# Patient Record
Sex: Female | Born: 1955 | Race: White | Hispanic: No | Smoking: Never smoker
Health system: Southern US, Community
[De-identification: ages and names within clinical notes are randomized; demographics above are authoritative.]

## PROBLEM LIST (undated history)

## (undated) DIAGNOSIS — K219 Gastro-esophageal reflux disease without esophagitis: Secondary | ICD-10-CM

## (undated) DIAGNOSIS — G4730 Sleep apnea, unspecified: Secondary | ICD-10-CM

## (undated) DIAGNOSIS — A048 Other specified bacterial intestinal infections: Secondary | ICD-10-CM

## (undated) DIAGNOSIS — J45909 Unspecified asthma, uncomplicated: Secondary | ICD-10-CM

## (undated) DIAGNOSIS — R51 Headache: Secondary | ICD-10-CM

## (undated) DIAGNOSIS — T8859XA Other complications of anesthesia, initial encounter: Secondary | ICD-10-CM

## (undated) DIAGNOSIS — K5792 Diverticulitis of intestine, part unspecified, without perforation or abscess without bleeding: Secondary | ICD-10-CM

## (undated) DIAGNOSIS — T4145XA Adverse effect of unspecified anesthetic, initial encounter: Secondary | ICD-10-CM

## (undated) DIAGNOSIS — M1990 Unspecified osteoarthritis, unspecified site: Secondary | ICD-10-CM

## (undated) DIAGNOSIS — N390 Urinary tract infection, site not specified: Secondary | ICD-10-CM

## (undated) DIAGNOSIS — F419 Anxiety disorder, unspecified: Secondary | ICD-10-CM

## (undated) DIAGNOSIS — R519 Headache, unspecified: Secondary | ICD-10-CM

## (undated) DIAGNOSIS — G2581 Restless legs syndrome: Secondary | ICD-10-CM

## (undated) DIAGNOSIS — F32A Depression, unspecified: Secondary | ICD-10-CM

## (undated) DIAGNOSIS — M797 Fibromyalgia: Secondary | ICD-10-CM

## (undated) DIAGNOSIS — M779 Enthesopathy, unspecified: Secondary | ICD-10-CM

## (undated) DIAGNOSIS — I10 Essential (primary) hypertension: Secondary | ICD-10-CM

## (undated) DIAGNOSIS — F329 Major depressive disorder, single episode, unspecified: Secondary | ICD-10-CM

## (undated) HISTORY — DX: Other specified bacterial intestinal infections: A04.8

## (undated) HISTORY — DX: Gastro-esophageal reflux disease without esophagitis: K21.9

## (undated) HISTORY — PX: FOOT SURGERY: SHX648

## (undated) HISTORY — DX: Urinary tract infection, site not specified: N39.0

## (undated) HISTORY — DX: Fibromyalgia: M79.7

## (undated) HISTORY — PX: PARTIAL HYSTERECTOMY: SHX80

## (undated) HISTORY — DX: Enthesopathy, unspecified: M77.9

---

## 1997-06-15 ENCOUNTER — Inpatient Hospital Stay (HOSPITAL_COMMUNITY): Admission: RE | Admit: 1997-06-15 | Discharge: 1997-06-16 | Payer: Self-pay | Admitting: Obstetrics and Gynecology

## 1998-09-20 ENCOUNTER — Encounter: Payer: Self-pay | Admitting: Emergency Medicine

## 1998-09-20 ENCOUNTER — Emergency Department (HOSPITAL_COMMUNITY): Admission: EM | Admit: 1998-09-20 | Discharge: 1998-09-20 | Payer: Self-pay | Admitting: Emergency Medicine

## 1998-12-09 ENCOUNTER — Ambulatory Visit (HOSPITAL_COMMUNITY): Admission: RE | Admit: 1998-12-09 | Discharge: 1998-12-09 | Payer: Self-pay | Admitting: Urology

## 1998-12-09 ENCOUNTER — Encounter: Payer: Self-pay | Admitting: Urology

## 1999-07-10 ENCOUNTER — Inpatient Hospital Stay (HOSPITAL_COMMUNITY): Admission: AD | Admit: 1999-07-10 | Discharge: 1999-07-14 | Payer: Self-pay | Admitting: *Deleted

## 1999-07-28 ENCOUNTER — Other Ambulatory Visit: Admission: RE | Admit: 1999-07-28 | Discharge: 1999-08-10 | Payer: Self-pay

## 2001-05-09 ENCOUNTER — Ambulatory Visit (HOSPITAL_COMMUNITY): Admission: RE | Admit: 2001-05-09 | Discharge: 2001-05-09 | Payer: Self-pay | Admitting: Internal Medicine

## 2001-05-09 ENCOUNTER — Encounter: Payer: Self-pay | Admitting: Internal Medicine

## 2001-12-09 ENCOUNTER — Emergency Department (HOSPITAL_COMMUNITY): Admission: EM | Admit: 2001-12-09 | Discharge: 2001-12-09 | Payer: Self-pay | Admitting: *Deleted

## 2001-12-09 ENCOUNTER — Encounter: Payer: Self-pay | Admitting: *Deleted

## 2002-06-18 ENCOUNTER — Emergency Department (HOSPITAL_COMMUNITY): Admission: EM | Admit: 2002-06-18 | Discharge: 2002-06-19 | Payer: Self-pay | Admitting: Emergency Medicine

## 2002-06-18 ENCOUNTER — Encounter: Payer: Self-pay | Admitting: Emergency Medicine

## 2002-07-06 ENCOUNTER — Encounter: Payer: Self-pay | Admitting: Internal Medicine

## 2002-07-06 ENCOUNTER — Ambulatory Visit (HOSPITAL_COMMUNITY): Admission: RE | Admit: 2002-07-06 | Discharge: 2002-07-06 | Payer: Self-pay | Admitting: Internal Medicine

## 2003-01-01 ENCOUNTER — Ambulatory Visit (HOSPITAL_COMMUNITY): Admission: RE | Admit: 2003-01-01 | Discharge: 2003-01-01 | Payer: Self-pay | Admitting: Internal Medicine

## 2003-01-01 ENCOUNTER — Encounter: Payer: Self-pay | Admitting: Internal Medicine

## 2003-03-16 ENCOUNTER — Ambulatory Visit (HOSPITAL_COMMUNITY): Admission: RE | Admit: 2003-03-16 | Discharge: 2003-03-16 | Payer: Self-pay | Admitting: Internal Medicine

## 2003-03-24 ENCOUNTER — Ambulatory Visit (HOSPITAL_COMMUNITY): Admission: RE | Admit: 2003-03-24 | Discharge: 2003-03-24 | Payer: Self-pay | Admitting: Internal Medicine

## 2003-08-31 ENCOUNTER — Emergency Department (HOSPITAL_COMMUNITY): Admission: EM | Admit: 2003-08-31 | Discharge: 2003-08-31 | Payer: Self-pay | Admitting: Family Medicine

## 2004-03-16 ENCOUNTER — Ambulatory Visit: Payer: Self-pay | Admitting: Psychiatry

## 2004-03-16 ENCOUNTER — Inpatient Hospital Stay (HOSPITAL_COMMUNITY): Admission: RE | Admit: 2004-03-16 | Discharge: 2004-03-20 | Payer: Self-pay | Admitting: Psychiatry

## 2004-07-12 ENCOUNTER — Ambulatory Visit (HOSPITAL_COMMUNITY): Admission: RE | Admit: 2004-07-12 | Discharge: 2004-07-12 | Payer: Self-pay | Admitting: Internal Medicine

## 2004-10-04 ENCOUNTER — Emergency Department (HOSPITAL_COMMUNITY): Admission: EM | Admit: 2004-10-04 | Discharge: 2004-10-04 | Payer: Self-pay | Admitting: Family Medicine

## 2005-02-02 ENCOUNTER — Encounter: Payer: Self-pay | Admitting: Emergency Medicine

## 2005-02-03 ENCOUNTER — Inpatient Hospital Stay (HOSPITAL_COMMUNITY): Admission: EM | Admit: 2005-02-03 | Discharge: 2005-02-06 | Payer: Self-pay | Admitting: Psychiatry

## 2005-02-03 ENCOUNTER — Ambulatory Visit: Payer: Self-pay | Admitting: Psychiatry

## 2005-08-09 ENCOUNTER — Emergency Department (HOSPITAL_COMMUNITY): Admission: EM | Admit: 2005-08-09 | Discharge: 2005-08-09 | Payer: Self-pay | Admitting: Family Medicine

## 2005-12-25 ENCOUNTER — Emergency Department (HOSPITAL_COMMUNITY): Admission: EM | Admit: 2005-12-25 | Discharge: 2005-12-25 | Payer: Self-pay | Admitting: Family Medicine

## 2006-03-22 ENCOUNTER — Encounter (HOSPITAL_COMMUNITY): Admission: RE | Admit: 2006-03-22 | Discharge: 2006-04-08 | Payer: Self-pay | Admitting: Internal Medicine

## 2006-08-09 ENCOUNTER — Inpatient Hospital Stay (HOSPITAL_COMMUNITY): Admission: AD | Admit: 2006-08-09 | Discharge: 2006-08-14 | Payer: Self-pay | Admitting: Internal Medicine

## 2008-01-14 ENCOUNTER — Ambulatory Visit (HOSPITAL_COMMUNITY): Admission: RE | Admit: 2008-01-14 | Discharge: 2008-01-14 | Payer: Self-pay | Admitting: Internal Medicine

## 2010-04-29 ENCOUNTER — Encounter: Payer: Self-pay | Admitting: Internal Medicine

## 2010-04-30 ENCOUNTER — Encounter: Payer: Self-pay | Admitting: Obstetrics & Gynecology

## 2010-08-25 NOTE — H&P (Signed)
NAMEANISTYN, GRADDY NO.:  [id number]   MEDICAL RECORD NO.:  [id number]          PATIENT TYPE:  IPS   LOCATION:  0506                          FACILITY:  [facility]   PHYSICIAN:  Geoffery Lyons, M.D.      DATE OF BIRTH:  [birth year]   DATE OF ADMISSION:  02/03/2005  DATE OF DISCHARGE:                         PSYCHIATRIC ADMISSION ASSESSMENT   IDENTIFYING INFORMATION:  The patient is a 55 year old divorced white female  who was voluntarily admitted to the [facility] on February 03, 2005.   HISTORY OF PRESENT ILLNESS:  The patient has a history of depression and  presents complaining of increased stressors of her difficult living  situation, health problems and her relationships with her daughters which  became overwhelming, and the patient took and overdose of approximately 40  Relafen tablets claiming that she wanted to go to sleep.  The patient was  taken to the hospital where she was kept for several days after receiving  charcoal treatment and then was transferred to the [facility].   PAST PSYCHIATRIC HISTORY:  This is the fifth hospitalization at the  [facility] for this patient.  She has had no outpatient  psychiatrist or counselor, although she has been referred to one by her  primary care doctor.   SOCIAL HISTORY:  She currently lives in a boarding home.  She works at  [employer], and she has a 12th grade education.  She is divorced and has 72-  year-old and 50 year old daughters.  She denies any legal issues at this  time.  She describes her daughters as being her social support system.   FAMILY HISTORY:  She has a sister who is an alcohol abuser.   ALCOHOL AND DRUG HISTORY:  She reports that she rarely drinks any alcohol,  and she does not use any type of illicit drugs.   PRIMARY CARE Martino Tompson:  Dr. Sherwood Gambler in [place], [place].   MEDICAL PROBLEMS:  Include fibromyalgia.  GERD.  Sleep apnea.   CURRENT  MEDICATIONS:  1.  Relafen 500 mg twice daily.  2.  Prozac 40 mg once daily.  3.  Klonopin 1 mg t.i.d. or q.i.d.  4.  Neurontin 600 q.i.d.  5.  Darvocet-N 100 one tablet q.i.d.   ALLERGIES:  PENICILLIN and CODEINE.  She will not take PREDNISONE.   PHYSICAL EXAMINATION:  Performed at [facility] Emergency Department.  Here  at the [facility], she is a well-developed, well-nourished  female who looks appropriate for her stated age of 55 years old.   DIAGNOSTIC STUDIES:  CBC was within normal limits.  Her blood chemistries  are within normal limits.  Her liver function tests are within normal  limits.  Urine pregnancy test is negative.  Her acetaminophen and salicylate  levels are within normal limits.  Her urine drug screen is positive for  benzos and tricyclic antidepressants.  Her urinalysis was normal.  She has a  TSH pending.   MENTAL STATUS EXAM:  She is alert and oriented x 4.  She has good eye  contact with constructed affect.  Her appearance was disheveled.  Her  behavior is cooperative.  Her speech was clear with even pace and tone.  Her  mood is anxious.  Her thought process was coherent without current suicidal  ideations or homicidal ideation. No evidence of psychosis or mania was  noted.  Her cognitive function, her concentration is normal.  Her memory is  impaired.  Her insight is poor.  Her judgment is poor.   DIAGNOSES:  AXIS I:  Major depressive disorder, recurrent, severe with  suicidal ideation.  AXIS II:  Deferred.  AXIS III:  Fibromyalgia.  Gastroesophageal reflux disease.  Sleep apnea.  AXIS IV:  Severe for problem with her primary support group, occupational  problems, housing problems, economic problems and medical problems.  AXIS V:  Current global assessment of function of 34, past year 55.   PLAN:  Admit the patient voluntarily and stabilize her to mood and thought.  We will work to increase her coping skills and decrease her stress.  We   would like to have a family session with her daughters prior to discharge.  She can follow up with the psychiatrist and counselor who were referred by  her primary care doctor, Dr. Sherwood Gambler.  Her tentative length of stay is 5-7  days.      Yolande Jolly, PA      Geoffery Lyons, M.D.  Electronically Signed    AHW/MEDQ  D:  02/03/2005  T:  02/03/2005  Job:  [id number]

## 2010-08-25 NOTE — Discharge Summary (Signed)
[name], BOYUM NO.:  [id number]   MEDICAL RECORD NO.:  [id number]          PATIENT TYPE:  IPS   LOCATION:  0506                          FACILITY:  [facility]   PHYSICIAN:  Anselm Jungling, MD  DATE OF BIRTH:  May 28, 1955   DATE OF ADMISSION:  02/03/2005  DATE OF DISCHARGE:  02/06/2005                                 DISCHARGE SUMMARY   IDENTIFYING DATA/REASON FOR ADMISSION:  The patient is a 55 year old  divorced white female voluntarily admitted due to increased depression, and  an overdose in a suicide attempt of 40 tablets of Relafen. The patient was  treated at [facility] for several days and then transferred to the  [facility]. This is her fifth hospitalization here at [facility]. She  is not involved in any outpatient mental health treatment at this time.  Please refer to the admission note for further details pertaining to the  symptoms, circumstances and history that led to her hospitalization.   INITIAL DIAGNOSTIC IMPRESSION:  She was given an AXIS I initial diagnosis of  major depressive disorder, recurrent, severe with suicidal ideation.   MEDICATIONS:  Psychotropics at the time of admission included Prozac 40 mg  daily, Klonopin 1 mg t.i.d. and Neurontin 600 q.i.d. In addition, she had  been taking nonpsychotropics, Relafen 500 mg b.i.d. and Darvocet N 100  q.i.d.   MEDICAL/LABORATORY:  As above. The patient was medically treated at [facility] prior to transfer to the inpatient psychiatric service.   The patient was continued on her regimen of Darvocet p.r.n., and was given  Orabase gel for a broken tooth.   HOSPITAL COURSE:  The patient was admitted to the adult inpatient service.  She was continued on her usual Prozac 40 mg daily, and Neurontin 600 mg  q.i.d. Klonopin was decreased to 0.75 mg t.i.d. Seroquel was available on a  p.r.n. basis, but patient indicated a desire to minimize her use of it  because it was medication  that she could not afford on an outpatient basis.   Over the patient's four-day hospital course, she did not experience any  further active thoughts of self-harm or suicidal ideation. She was a  reasonably good participant in the treatment program. On the fourth hospital  day, she requested discharge and was able to indicate convincingly that she  had no further thoughts of self-harm or suicide.   AFTERCARE:  The patient is to follow-up with Elfredia Nevins, her primary  care physician, on February 19, 2005. She was to resume her usual  medications, as described above. Pain medications were to be prescribed as  by Dr. Sherwood Gambler.   DISCHARGE DIAGNOSES:  AXIS I: Major depressive disorder, recurrent, severe.  AXIS II: Deferred.  AXIS III: No acute or chronic illnesses.  AXIS IV: Stressors:  severe.  AXIS V: GAF on discharge 60.           ______________________________  Anselm Jungling, MD  Electronically Signed     SPB/MEDQ  D:  02/06/2005  T:  02/06/2005  Job:  [id number]

## 2010-08-25 NOTE — Discharge Summary (Signed)
Hannah Jordan, Hannah Jordan               ACCOUNT NO.:  [id number]   MEDICAL RECORD NO.:  [id number]          PATIENT TYPE:  INP   LOCATION:  A311                          FACILITY:  [facility]   PHYSICIAN:  Madelin Rear. Sherwood Gambler, MD  DATE OF BIRTH:  [birth year]   DATE OF ADMISSION:  08/09/2006  DATE OF DISCHARGE:  05/07/2008LH                               DISCHARGE SUMMARY   DISCHARGE DIAGNOSIS:  1. Diverticulitis.  2. Oral stomatitis secondary to Candida.  3. Fibromyalgia.  4. Gastroesophageal reflux disease.  5. Acute allergic reaction secondary to quinolone.   DISCHARGE MEDICATIONS:  1. Xanax 0.5 p.o. t.i.d. p.r.n.  2. Prozac 20 mg p.o. daily.  3. Lunesta 30 mg p.o. daily.  4. Darvocet N 100 q.i.d. p.r.n. pain.  5. Flagyl 500 mg p.o. q.i.d.  6. Doxycycline 100 mg p.o. b.i.d.   SUMMARY:  The patient was admitted after outpatient confirmation by CT  of diverticulitis of a minimal degree without perforation or abscess  with poor response to oral therapy.  Due to increase in her pain and  poor response orally she was admitted for IV therapy.  A CT was obtained  again and showed no evidence of perforation or abscess.  Gradually she  improved, however, she did initially develop a facial rash after some  Levaquin was given parenterally.  This was resolved without any other  anaphylaxis complications.  She was changed to tetracycline combined  with Flagyl for anaerobe and gram-negative coverage.   On the day of discharge she was stable.  Follow-up in the office.      Madelin Rear. Sherwood Gambler, MD  Electronically Signed     LJF/MEDQ  D:  09/02/2006  T:  09/02/2006  Job:  [id number]

## 2010-08-25 NOTE — H&P (Signed)
Hannah Jordan, Hannah Jordan               ACCOUNT NO.:  [id number]   MEDICAL RECORD NO.:  [id number]          PATIENT TYPE:  INP   LOCATION:  A311                          FACILITY:  [facility]   PHYSICIAN:  Madelin Rear. Sherwood Gambler, MD  DATE OF BIRTH:  [birth year]   DATE OF ADMISSION:  08/09/2006  DATE OF DISCHARGE:  LH                              HISTORY & PHYSICAL   CHIEF COMPLAINT:  Abdominal pain.   HISTORY OF PRESENT ILLNESS:  The patient was seen August 05, 2006 in the  office with left lower quadrant abdominal pain that was subsequently  confirmed as diverticulitis of a mild degree by CT scan at an outpatient  facility.  She had associated nausea and malaise, possible a low-grade  fever, but no true rigors.  No hematemesis, hematochezia, or melena.  She also complained of a right lower lip lesion after a fall for 2 weeks  with a granuloma that needed to be resected as an outpatient.  The  location of her pain as in the left lower quadrant.  Severity is  moderate and actually possibly worse as opposed to better after 5 days  of outpatient Cipro and Flagyl orally.  Associated symptoms as above.  Increased pain with ambulation and coughing.   PAST MEDICAL HISTORY:  1. Status post partial hysterectomy.  2. Left foot surgery.  3. C-section x2.  4. Fibromyalgia.  5. Recurrent tendinitis.  6. Recurrent urinary tract infection.  7. Gastroesophageal reflux disease.  8. Previously treated Helicobacter pylori.   Noted PENICILLIN, CODEINE and PROZAC allergy.   SOCIAL HISTORY:  Nonsmoker. No alcohol or other drug use.   FAMILY HISTORY:  Diabetes mellitus, cancer, hypothyroidism,  hypertension, otherwise noncontributory.   REVIEW OF SYSTEMS:  In detail, no  headache, blurred vision, double  vision, or hearing changes.  No sore throat or odynophagia, dysphasia,  no tinnitus.  No stiff neck.  No thyromegaly or masses of the neck.  CARDIOPULMONARY: No palpitations, syncope, chest pain. No shortness  of  breath, cough, or sputum.  SKIN:  No allergy, rash, no exposure to  ticks.  Lip lesion as described above, resected already and healing  well.  ABDOMEN:  No diarrhea. see HPI for complete details. No vomiting.  Positive nausea.  EXTREMITIES: No clubbing, cyanosis, or edema noted by  the patient. ALLERGY/IMMUNOLOGIC:  No swollen glands, no urticaria, no  skin rash.  CONSTITUTIONAL:  As under HPI.   PHYSICAL EXAMINATION:  GENERAL:  She is awake, alert, cooperative.  Moves easily about stretcher.  HEAD/NECK: No JVD or adenopathy.  NECK:  Supple.  Chest: Clear.  CARDIAC:  Regular rhythm.  No murmur, gallop, or  rub.  ABDOMEN: Positive normoactive bowel sounds.  No organomegaly or  masses. Positive tender left lower quadrant, with intermittent voluntary  guarding.  EXTREMITIES: No clubbing, cyanosis, or edema.  NEUROLOGIC:  Nonfocal.   LABORATORIES:  Outpatient CT as above, showing mild diverticulitis,  otherwise negative.   IMPRESSION:  1. Diverticulitis. Failure to respond to conservative outpatient      regimen and oral therapy and in fact possibly worse.  Will  admit      her for parenteral antibiotics.  Repeat CT. Make sure she did not      abscess signs, and possible surgical consultation as indicated with      Dr. Malvin Johns.  2. Fibromyalgia. Stable at present, p.r.n. analgesia as indicated.  3. Gastroesophageal reflux disease.  Continue her proton pump      inhibitor in the form of Protonix.      Madelin Rear. Sherwood Gambler, MD  Electronically Signed     LJF/MEDQ  D:  08/09/2006  T:  08/09/2006  Job:  [id number]

## 2010-08-25 NOTE — Discharge Summary (Signed)
NAMECASSIOPEIA, FLORENTINO NO.:  [id number]   MEDICAL RECORD NO.:  [id number]          PATIENT TYPE:  IPS   LOCATION:  0508                          FACILITY:  [facility]   PHYSICIAN:  Geoffery Lyons, M.D.      DATE OF BIRTH:  Feb 17, 1956   DATE OF ADMISSION:  03/16/2004  DATE OF DISCHARGE:  03/20/2004                                 DISCHARGE SUMMARY   CHIEF COMPLAINT AND PRESENT ILLNESS:  This was the second admission to [facility] for this 55 year old single white female voluntarily  admitted.  Reported increasing depression over the past 2-3 weeks secondary  to stress with her daughter, living in the home with her, smoking marijuana  with her friends.  Reported depression secondary to her stress over her  mother's death in 10/25/2004and family conference around the settling of  the estate.  Endorsed increased sleep up to about 16 hours per day in the  last couple of weeks.  Endorsed over-using her Klonopin along with her  Prozac.  Has been isolating herself, sluggish, not being able to work on her  normal routine since last 02/01/2023.   PAST PSYCHIATRIC HISTORY:  No current psychiatric treatment.  Previously  followed by Dr. Shelly Flatten up until a couple of years when she stopped  seeing him.  Second time at [facility].  Last time in May  of 2001.  One prior overdose in 2001.  Being treated with Seroquel, Klonopin  and Prozac.   ALCOHOL/DRUG HISTORY:  Denies any other substance use other than taking  Klonopin at times.   PAST MEDICAL HISTORY:  Sleep apnea, fibromyalgia, asthma, interstitial  cystitis, gastroesophageal reflux.   MEDICATIONS:  Neurontin 300 mg in the morning, 600 mg at night, Seroquel 25  mg, 1-2 at night, Prozac 40 mg daily, Levaquin 400 mg for bronchitis.  Wears  a CPAP machine at night.   PHYSICAL EXAMINATION:  Performed and failed to show any acute findings.   LABORATORY DATA:  CBC with hemoglobin 15.5, white  blood cells 8.5.  Blood  chemistry with glucose 104.  Liver enzymes within normal limits.  TSH 1.827.   MENTAL STATUS EXAM:  Upon admission revealed a pleasant, cooperative but  drowsy female with some psychomotor retardation, difficulty focusing because  of her sedation.  Poor historian because of her sluggishness.  Speech was  slow and slurred.  Mood was depressed.  Thought processes were logical but  somewhat vague, foggy, not sure of what her answers are on certain  questions.  Had to stop and think.  Some passive suicidal thoughts,  frustrated and disgusted with her family.  Grieving the loss of her mother.  No homicidal thoughts.  No auditory or visual hallucinations.  Cognition was  well-preserved.   ADMISSION DIAGNOSES:   AXIS I:  Major depression, recurrent.   AXIS II:  No diagnosis.   AXIS III:  1.  Hypertension.  2.  Gastroesophageal reflux.  3.  Sleep apnea.  4.  Fibromyalgia.   AXIS IV:  Moderate.   AXIS V:  Global  Assessment of Functioning upon admission 30; highest Global  Assessment of Functioning in the last year 60-65.   HOSPITAL COURSE:  She was admitted and started in individual and group  psychotherapy.  She was given trazodone for sleep.  Given Neurontin 300 mg  in the morning, 600 mg at bedtime, Seroquel 25 mg, 1-2 at night, Prozac 40  mg daily, Klonopin 1 mg three times a day, Levaquin 500 mg daily.  Medications were rearranged to Klonopin 0.5 mg twice a day and 0.5 mg every  six hours as needed.  She was started on Cymbalta 30 mg per day.  Prozac was  discontinued and Seroquel was placed at 25 mg every six hours as needed.  She apparently, according to family, was having a difficult time keeping up  with her medications.  Long history of depression.  Was disappointed in her  daughter, alienated from her.  She felt her siblings did not understand her.  Continued to endorse the suicidal ruminations, overwhelmed with her  fibromyalgia and her depression.   As she settled in and she was able to  rest, medications were adjusted.  Her mood improved.  Her affect started  becoming brighter.  On December 12th, she was in full contact with reality.  There were no suicidal ideation, no homicidal ideation, no hallucinations,  no delusions.  Feeling much better, more insightful, had worked on Materials engineer, on stress management.  Family has been supportive.  They showed a  willingness to try to understand.  She was able to continue to grieve the  death of the mother.  Overall, she felt much better.  She was denying any  suicidal or homicidal ideation, felt she was ready to go home.  We went  ahead and discharged to outpatient follow-up.   DISCHARGE DIAGNOSES:   AXIS I:  Major depression, recurrent.   AXIS II:  No diagnosis.   AXIS III:  1.  Arterial hypertension.  2.  Gastroesophageal reflux.  3.  Sleep apnea.  4.  Fibromyalgia.   AXIS IV:  Moderate.   AXIS V:  Global Assessment of Functioning upon discharge 50.   DISCHARGE MEDICATIONS:  1.  Neurontin 300 mg, 1 in the morning and 2 at night.  2.  Klonopin 0.5 mg, 1 twice a day as needed.  3.  Cymbalta 30 mg for 4 more days and then 60 mg per day.  4.  Trazodone 50 mg at night for sleep.   FOLLOW UP:  Therapist, Marvia Pickles, and [facility] as  well as Dr. Lang Snow at [facility].      IL/MEDQ  D:  04/12/2004  T:  04/12/2004  Job:  [id number]

## 2010-08-25 NOTE — H&P (Signed)
[name], Jordan NO.:  [id number]   MEDICAL RECORD NO.:  [id number]          PATIENT TYPE:  IPS   LOCATION:  0508                          FACILITY:  [facility]   PHYSICIAN:  Geoffery Lyons, M.D.      DATE OF BIRTH:  11-30-1955   DATE OF ADMISSION:  03/16/2004  DATE OF DISCHARGE:                         PSYCHIATRIC ADMISSION ASSESSMENT   IDENTIFYING INFORMATION:  This is a 55 year old single white female who goes  by the nickname of [name].  She is a voluntary admission.   HISTORY OF PRESENT ILLNESS:  This patient was referred by mental health  after she reported increased depression over the past 2-3 weeks, secondary  to stress with her daughter living in the home with her, smoking marijuana  with her friends.  The patient also reports depression secondary to her  stress over her mother's death in 04-Nov-2004and family conflicts  surrounding the settling of the estate.  The patient endorses increased  sleep, up to about 16 hours per day in the last couple of weeks.  She does  endorse over using her Klonopin at times along with her Prozac.  She has  been isolating herself, sluggish, has not been able to work at her normal  routines since late 11-Feb-2023.  She endorses suicide ideation with a plan to  overdose, denies any abuse of alcohol or street drugs.   PAST PSYCHIATRIC HISTORY:  The patient has no current psychiatric care.  She  was previously followed by Dr. Shelly Flatten until a couple of years ago when  she stopped seeing him.  This is her second admission to [facility] with her last one being in May of 2001.  She does  have a history of 1 prior overdose in 2001, states she was diagnosed with  major depression, does not remember what medications she was treated with in  the past.  She is currently on Seroquel and Klonopin and Prozac, no  medicines of which she is taking regularly.   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  No  legal charges.  She currently has her daughter living with her who is  smoking marijuana.  She is having to support her, feels bad about this.   FAMILY HISTORY:  The patient is one of seven children.  She reports both her  mother and her daughter have histories of depression.   ALCOHOL AND DRUG HISTORY:  The patient denies any substance abuse but does  admit to over taking her Klonopin at times and taking her Prozac  irregularly.   PAST MEDICAL HISTORY:  The patient's primary care physician is Dr. Sherwood Gambler,  M.D. in [place] and she also has been seen in the past, last time 2 years  ago, by Dr. Gerilyn Pilgrim, the neurologist, for brain imaging and was diagnosed  with sleep apnea.  Medical problems are fibromyalgia, asthma, interstitial  cystitis, sleep apnea by history, and GERD by history.   MEDICATIONS:  Gabapentin 300 mg q.a.m., 600 mg q.h.s., Seroquel 25 mg 1-2  tabs q.h.s., Prozac 40 mg daily and she has been on Prozac now for more than  a year.  She takes amphetamine salts at times during the day but does not  know the dose.  She has also taken Levaquin 400 mg daily in the past for  bronchitis but was last prescribed this at the beginning of November.  She  wears a C-PAP machine at night.   DRUG ALLERGIES:  PENICILLIN.   POSITIVE PHYSICAL FINDINGS:  This is a well-nourished, well-developed female  who is in no acute distress.  She does appear rather sedated and states she  is not usually this sedated at home.  Vital signs on admission to the unit:  She is 5 feet 2.5 inches tall, weighs 174 pounds, well-nourished, well  groomed.  Temperature 97.4, pulse 72, respirations 18, blood pressure  167/83.  Her full physical examination is noted in the record.  Other than  her motor movements are somewhat slow and she appears  to be sedated, is  somewhat mentally foggy but generally has a normal motor exam with no focal  neurological findings.   DIAGNOSTIC STUDIES:  CBC is within normal limits.  Her white blood cell  count is 8.5.  Hemoglobin 15.5, hematocrit 45.5, platelets normal at  289,000.  Electrolytes are normal.  Potassium 4.2, BUN 10, creatinine 0.7,  glucose is 104 and this is a random specimen done at 10 at night.  Liver  enzymes are normal, with SGOT 18, SGPT 20.  TSH is normal at 1.827.  Routine  urine and urine drug screen are currently pending.   MENTAL STATUS EXAM:  This is a pleasant and cooperative but drowsy and  sedated female with some motor slowing, having difficulty focusing because  of her sedation and she is a poor historian because of her sluggishness.  Speech is slowed and slurred.  Mood is depressed, thought process logical  but somewhat vague, foggy, not sure what her answers are to certain  questions, has to stop and think, shakes her head sometimes trying to wake  up a little bit.  She does have some passive suicidal thought, frustrated  and disgusted with her family and is grieving the loss of her mother.  No  homicidal thought, no auditory or visual hallucinations.  Cognitively she is  intact and oriented x3.  Insight is adequate, impulse control and judgment  within normal limits.  Intelligence within normal limits.   ADMISSION DIAGNOSIS:   AXIS I:  Major depression, recurrent, severe.   AXIS II:  Deferred.   AXIS III:  Hypertension, gastroesophageal reflux disease, sleep apnea, and  fibromyalgia.   AXIS IV:  Severe stress with parenting her daughter and difficulty keeping a  job and earning adequate income due to her depression symptoms.   AXIS V:  Current 30, past year 55-65.   INITIAL PLAN OF CARE:  Voluntarily admit the patient with q.15 minute checks  in place, with a goal of alleviating her suicidal thought.  We will, because of her history of restless leg  syndrome, will check a ferritin level and we  are going to get her daughter to bring her sleep apnea machine in here.  Meanwhile, to reduce her sedation we are going to decrease her Klonopin to  0.5 mg b.i.d.  We will continue her Neurontin which she was taking  for  fibromyalgia symptoms and for restless leg.  We have stopped her  amphetamines salts temporarily.  She has been on Prozac for more than a year  and we are going to discontinue that and start her on Cymbalta 30 mg p.o.  daily and we will stop her Seroquel at this point and make that available to  her 25 mg q.6h p.r.n.  We will check a urine on her and a urine drug screen.   ESTIMATED LENGTH OF STAY:  5 days.    Marg  MAS/MEDQ  D:  03/17/2004  T:  03/17/2004  Job:  [id number]

## 2011-08-06 ENCOUNTER — Telehealth: Payer: Self-pay

## 2011-08-06 NOTE — Telephone Encounter (Signed)
LMOM for a return call.  

## 2011-08-06 NOTE — Telephone Encounter (Signed)
Pt returned call and is scheduled to see Gerrit Halls, NP on 08/09/2011 @ 3:30 PM. ( She has hx of diverticulitis and has abd pain.

## 2011-08-09 ENCOUNTER — Ambulatory Visit: Payer: Self-pay | Admitting: Gastroenterology

## 2011-08-16 ENCOUNTER — Ambulatory Visit: Payer: Self-pay | Admitting: Gastroenterology

## 2011-08-22 ENCOUNTER — Ambulatory Visit: Payer: Self-pay | Admitting: Urgent Care

## 2012-05-02 ENCOUNTER — Encounter (HOSPITAL_BASED_OUTPATIENT_CLINIC_OR_DEPARTMENT_OTHER): Payer: Self-pay | Admitting: *Deleted

## 2012-05-02 ENCOUNTER — Emergency Department (HOSPITAL_BASED_OUTPATIENT_CLINIC_OR_DEPARTMENT_OTHER)
Admission: EM | Admit: 2012-05-02 | Discharge: 2012-05-02 | Disposition: A | Discharge status: Home / Self Care | Payer: Medicaid Other | Attending: Emergency Medicine | Admitting: Emergency Medicine

## 2012-05-02 DIAGNOSIS — R05 Cough: Secondary | ICD-10-CM

## 2012-05-02 DIAGNOSIS — Z8619 Personal history of other infectious and parasitic diseases: Secondary | ICD-10-CM | POA: Insufficient documentation

## 2012-05-02 DIAGNOSIS — Z87440 Personal history of urinary (tract) infections: Secondary | ICD-10-CM | POA: Insufficient documentation

## 2012-05-02 DIAGNOSIS — Z8719 Personal history of other diseases of the digestive system: Secondary | ICD-10-CM | POA: Insufficient documentation

## 2012-05-02 DIAGNOSIS — Z8739 Personal history of other diseases of the musculoskeletal system and connective tissue: Secondary | ICD-10-CM | POA: Insufficient documentation

## 2012-05-02 DIAGNOSIS — R059 Cough, unspecified: Secondary | ICD-10-CM | POA: Insufficient documentation

## 2012-05-02 DIAGNOSIS — J029 Acute pharyngitis, unspecified: Secondary | ICD-10-CM | POA: Insufficient documentation

## 2012-05-02 MED ORDER — ALBUTEROL SULFATE HFA 108 (90 BASE) MCG/ACT IN AERS
2.0000 | INHALATION_SPRAY | RESPIRATORY_TRACT | Status: DC
  Administered 2012-05-02: 2 via RESPIRATORY_TRACT

## 2012-05-02 MED ORDER — ALBUTEROL SULFATE HFA 108 (90 BASE) MCG/ACT IN AERS
INHALATION_SPRAY | RESPIRATORY_TRACT | Status: AC
  Filled 2012-05-02: qty 6.7

## 2012-05-02 MED ORDER — HYDROCOD POLST-CHLORPHEN POLST 10-8 MG/5ML PO LQCR: 5.0000 mL | Freq: Two times a day (BID) | ORAL | Status: DC | PRN

## 2012-05-02 NOTE — ED Notes (Signed)
Pt c/o URI symptoms x 1 week 

## 2012-05-02 NOTE — ED Provider Notes (Signed)
History     CSN: [id number]  Arrival date & time 05/02/12  1736   First MD Initiated Contact with Patient 05/02/12 1805      Chief Complaint  Patient presents with  . URI    (Consider location/radiation/quality/duration/timing/severity/associated sxs/prior treatment) Patient is a 57 y.o. female presenting with URI. The history is provided by the patient. No language interpreter was used.  URI The primary symptoms include sore throat and cough. Primary symptoms do not include fever, nausea, vomiting or rash. The current episode started 3 to 5 days ago. This is a new problem. The problem has not changed since onset. The onset of the illness is associated with exposure to sick contacts.    Past Medical History  Diagnosis Date  . Fibromyalgia   . Tendinitis   . Urinary tract infection   . Gastroesophageal reflux disease   . Helicobacter pylori (H. pylori)      treated    Past Surgical History  Procedure Date  . Partial hysterectomy   . Foot surgery     left  . Cesarean section     x 2    History reviewed. No pertinent family history.  History  Substance Use Topics  . Smoking status: Never Smoker   . Smokeless tobacco: Not on file  . Alcohol Use: No    OB History    Grav Para Term Preterm Abortions TAB SAB Ect Mult Living                  Review of Systems  Constitutional: Negative for fever.  HENT: Positive for sore throat.   Respiratory: Positive for cough.   Cardiovascular: Negative.   Gastrointestinal: Negative for nausea and vomiting.  Skin: Negative for rash.    Allergies  Penicillins and Prednisone  Home Medications   Current Outpatient Rx  Name  Route  Sig  Dispense  Refill  . HYDROCOD POLST-CPM POLST ER 10-8 MG/5ML PO LQCR   Oral   Take 5 mLs by mouth every 12 (twelve) hours as needed.   140 mL   0     BP 177/81  Pulse 71  Temp 98 F (36.7 C) (Oral)  Resp 18  Ht 5\' 2"  (1.575 m)  Wt 190 lb (86.183 kg)  BMI 34.75 kg/m2  SpO2  99%  Physical Exam  Nursing note and vitals reviewed. Constitutional: She appears well-developed and well-nourished.  HENT:  Head: Normocephalic and atraumatic.  Right Ear: External ear normal.  Left Ear: External ear normal.  Nose: Rhinorrhea present.  Mouth/Throat: Posterior oropharyngeal erythema present.  Eyes: Conjunctivae normal and EOM are normal.  Neck: Neck supple.  Cardiovascular: Normal rate and regular rhythm.   Pulmonary/Chest: Effort normal and breath sounds normal.  Musculoskeletal: Normal range of motion.    ED Course  Procedures (including critical care time)  Labs Reviewed - No data to display No results found.   1. Cough       MDM  Pt given an inhaler:pt has persistent cough on exam:no antibiotics needed at this time        Teressa Lower, NP 05/02/12 Rickey Primus

## 2012-05-02 NOTE — ED Provider Notes (Signed)
Medical screening examination/treatment/procedure(s) were performed by non-physician practitioner and as supervising physician I was immediately available for consultation/collaboration.   Carleene Cooper III, MD 05/02/12 2010

## 2012-05-13 ENCOUNTER — Other Ambulatory Visit (HOSPITAL_COMMUNITY): Payer: Self-pay | Admitting: Internal Medicine

## 2012-05-13 DIAGNOSIS — Z0000 Encounter for general adult medical examination without abnormal findings: Secondary | ICD-10-CM

## 2012-05-19 ENCOUNTER — Ambulatory Visit (HOSPITAL_COMMUNITY): Payer: Medicaid Other

## 2012-05-22 ENCOUNTER — Ambulatory Visit (HOSPITAL_COMMUNITY): Payer: Medicaid Other

## 2012-05-28 ENCOUNTER — Telehealth: Payer: Self-pay

## 2012-05-28 NOTE — Telephone Encounter (Signed)
Pt was referred ;by Dr. Sherwood Gambler for screening colonoscopy. Hx of diverticulosis. Ov with AS on 06/23/2012.

## 2012-05-29 ENCOUNTER — Ambulatory Visit (HOSPITAL_COMMUNITY): Payer: Medicaid Other

## 2012-06-11 ENCOUNTER — Ambulatory Visit (HOSPITAL_COMMUNITY)
Admission: RE | Admit: 2012-06-11 | Discharge: 2012-06-11 | Disposition: A | Discharge status: Home / Self Care | Payer: Medicaid Other | Source: Ambulatory Visit | Attending: Internal Medicine | Admitting: Internal Medicine

## 2012-06-11 DIAGNOSIS — Z1231 Encounter for screening mammogram for malignant neoplasm of breast: Secondary | ICD-10-CM | POA: Insufficient documentation

## 2012-06-23 ENCOUNTER — Ambulatory Visit: Payer: Medicaid Other | Admitting: Gastroenterology

## 2012-07-17 ENCOUNTER — Ambulatory Visit: Payer: Medicaid Other | Admitting: Gastroenterology

## 2012-07-30 ENCOUNTER — Ambulatory Visit: Payer: Medicaid Other | Admitting: Gastroenterology

## 2012-08-07 ENCOUNTER — Ambulatory Visit: Payer: Medicaid Other | Admitting: Gastroenterology

## 2012-08-27 ENCOUNTER — Telehealth: Payer: Self-pay | Admitting: Gastroenterology

## 2012-08-27 ENCOUNTER — Ambulatory Visit: Payer: Medicaid Other | Admitting: Gastroenterology

## 2012-08-27 NOTE — Telephone Encounter (Signed)
Pt was a no show

## 2012-09-02 NOTE — Telephone Encounter (Signed)
Please send PCP letter. Patient has no showed eight times in the last one year and has yet been seen to schedule her colonoscopy. No further attempts on our part.

## 2012-09-09 ENCOUNTER — Encounter: Payer: Self-pay | Admitting: Gastroenterology

## 2012-09-09 NOTE — Telephone Encounter (Signed)
I may have not routed to you to create letter for PCP. Sending again.

## 2012-09-12 NOTE — Telephone Encounter (Signed)
PCP is aware. I LMOM and faxed letter

## 2013-07-09 ENCOUNTER — Other Ambulatory Visit (HOSPITAL_COMMUNITY): Payer: Self-pay | Admitting: Internal Medicine

## 2013-07-09 DIAGNOSIS — Z1231 Encounter for screening mammogram for malignant neoplasm of breast: Secondary | ICD-10-CM

## 2013-07-13 ENCOUNTER — Ambulatory Visit (HOSPITAL_COMMUNITY): Payer: Medicaid Other

## 2013-08-13 ENCOUNTER — Ambulatory Visit (HOSPITAL_COMMUNITY): Payer: Medicaid Other

## 2013-08-20 ENCOUNTER — Ambulatory Visit (HOSPITAL_COMMUNITY): Payer: Medicaid Other | Attending: Internal Medicine

## 2013-11-05 ENCOUNTER — Encounter (INDEPENDENT_AMBULATORY_CARE_PROVIDER_SITE_OTHER): Payer: Self-pay | Admitting: *Deleted

## 2013-11-26 ENCOUNTER — Encounter (INDEPENDENT_AMBULATORY_CARE_PROVIDER_SITE_OTHER): Payer: Self-pay | Admitting: *Deleted

## 2013-11-26 ENCOUNTER — Other Ambulatory Visit (INDEPENDENT_AMBULATORY_CARE_PROVIDER_SITE_OTHER): Payer: Self-pay | Admitting: *Deleted

## 2013-11-26 DIAGNOSIS — Z800 Family history of malignant neoplasm of digestive organs: Secondary | ICD-10-CM

## 2013-11-26 DIAGNOSIS — Z1211 Encounter for screening for malignant neoplasm of colon: Secondary | ICD-10-CM

## 2014-01-08 ENCOUNTER — Telehealth (INDEPENDENT_AMBULATORY_CARE_PROVIDER_SITE_OTHER): Payer: Self-pay | Admitting: *Deleted

## 2014-01-08 DIAGNOSIS — Z1211 Encounter for screening for malignant neoplasm of colon: Secondary | ICD-10-CM

## 2014-01-08 NOTE — Telephone Encounter (Signed)
Patient needs movi prep 

## 2014-01-11 MED ORDER — PEG 3350-KCL-NA BICARB-NACL 420 G PO SOLR: 4000.0000 mL | Freq: Once | ORAL | Status: DC

## 2014-03-03 ENCOUNTER — Ambulatory Visit (HOSPITAL_COMMUNITY): Admission: RE | Admit: 2014-03-03 | Payer: Medicaid Other | Source: Ambulatory Visit | Admitting: Internal Medicine

## 2014-03-03 ENCOUNTER — Encounter (HOSPITAL_COMMUNITY): Admission: RE | Payer: Self-pay | Source: Ambulatory Visit

## 2014-03-03 SURGERY — COLONOSCOPY
Anesthesia: Moderate Sedation

## 2014-10-08 ENCOUNTER — Other Ambulatory Visit (HOSPITAL_COMMUNITY): Payer: Self-pay | Admitting: Internal Medicine

## 2014-10-08 DIAGNOSIS — Z1231 Encounter for screening mammogram for malignant neoplasm of breast: Secondary | ICD-10-CM

## 2014-10-14 ENCOUNTER — Ambulatory Visit (HOSPITAL_COMMUNITY): Payer: Medicaid Other

## 2015-01-11 ENCOUNTER — Encounter (HOSPITAL_BASED_OUTPATIENT_CLINIC_OR_DEPARTMENT_OTHER): Payer: Self-pay | Admitting: Emergency Medicine

## 2015-01-11 ENCOUNTER — Emergency Department (HOSPITAL_BASED_OUTPATIENT_CLINIC_OR_DEPARTMENT_OTHER): Payer: Medicaid Other

## 2015-01-11 ENCOUNTER — Emergency Department (HOSPITAL_BASED_OUTPATIENT_CLINIC_OR_DEPARTMENT_OTHER)
Admission: EM | Admit: 2015-01-11 | Discharge: 2015-01-11 | Disposition: A | Discharge status: Home / Self Care | Payer: Medicaid Other | Attending: Emergency Medicine | Admitting: Emergency Medicine

## 2015-01-11 DIAGNOSIS — Z8719 Personal history of other diseases of the digestive system: Secondary | ICD-10-CM | POA: Insufficient documentation

## 2015-01-11 DIAGNOSIS — R42 Dizziness and giddiness: Secondary | ICD-10-CM | POA: Insufficient documentation

## 2015-01-11 DIAGNOSIS — Z8619 Personal history of other infectious and parasitic diseases: Secondary | ICD-10-CM | POA: Diagnosis not present

## 2015-01-11 DIAGNOSIS — R05 Cough: Secondary | ICD-10-CM

## 2015-01-11 DIAGNOSIS — Z880 Allergy status to penicillin: Secondary | ICD-10-CM | POA: Insufficient documentation

## 2015-01-11 DIAGNOSIS — Z87440 Personal history of urinary (tract) infections: Secondary | ICD-10-CM | POA: Insufficient documentation

## 2015-01-11 DIAGNOSIS — Z8739 Personal history of other diseases of the musculoskeletal system and connective tissue: Secondary | ICD-10-CM | POA: Insufficient documentation

## 2015-01-11 DIAGNOSIS — Z792 Long term (current) use of antibiotics: Secondary | ICD-10-CM | POA: Insufficient documentation

## 2015-01-11 DIAGNOSIS — R059 Cough, unspecified: Secondary | ICD-10-CM

## 2015-01-11 DIAGNOSIS — L0100 Impetigo, unspecified: Secondary | ICD-10-CM | POA: Insufficient documentation

## 2015-01-11 LAB — BASIC METABOLIC PANEL WITH GFR
Anion gap: 10 (ref 5–15)
BUN: 14 mg/dL (ref 6–20)
CO2: 25 mmol/L (ref 22–32)
Calcium: 9.1 mg/dL (ref 8.9–10.3)
Chloride: 103 mmol/L (ref 101–111)
Creatinine, Ser: 0.74 mg/dL (ref 0.44–1.00)
GFR calc Af Amer: >60 mL/min
GFR calc non Af Amer: >60 mL/min
Glucose, Bld: 93 mg/dL (ref 65–99)
Potassium: 3.8 mmol/L (ref 3.5–5.1)
Sodium: 138 mmol/L (ref 135–145)

## 2015-01-11 LAB — CBC WITH DIFFERENTIAL/PLATELET
Basophils Absolute: 0.1 10*3/uL (ref 0.0–0.1)
Basophils Relative: 1 %
Eosinophils Absolute: 0.3 10*3/uL (ref 0.0–0.7)
Eosinophils Relative: 4 %
HCT: 41.2 % (ref 36.0–46.0)
Hemoglobin: 14.1 g/dL (ref 12.0–15.0)
Lymphocytes Relative: 34 %
Lymphs Abs: 3 10*3/uL (ref 0.7–4.0)
MCH: 30.3 pg (ref 26.0–34.0)
MCHC: 34.2 g/dL (ref 30.0–36.0)
MCV: 88.4 fL (ref 78.0–100.0)
Monocytes Absolute: 0.6 10*3/uL (ref 0.1–1.0)
Monocytes Relative: 7 %
Neutro Abs: 4.7 10*3/uL (ref 1.7–7.7)
Neutrophils Relative %: 54 %
Platelets: 257 10*3/uL (ref 150–400)
RBC: 4.66 MIL/uL (ref 3.87–5.11)
RDW: 13.5 % (ref 11.5–15.5)
WBC: 8.7 10*3/uL (ref 4.0–10.5)

## 2015-01-11 MED ORDER — MUPIROCIN 2 % EX OINT
TOPICAL_OINTMENT | Freq: Once | CUTANEOUS | Status: AC
  Administered 2015-01-11: 15:00:00 via NASAL
  Filled 2015-01-11: qty 22

## 2015-01-11 MED ORDER — MUPIROCIN CALCIUM 2 % EX CREA
TOPICAL_CREAM | CUTANEOUS | Status: AC
  Filled 2015-01-11: qty 15

## 2015-01-11 MED ORDER — CEPHALEXIN 250 MG PO CAPS
1000.0000 mg | ORAL_CAPSULE | Freq: Once | ORAL | Status: AC
  Administered 2015-01-11: 1000 mg via ORAL
  Filled 2015-01-11: qty 4

## 2015-01-11 MED ORDER — ACETAMINOPHEN 500 MG PO TABS
1000.0000 mg | ORAL_TABLET | Freq: Once | ORAL | Status: AC
  Administered 2015-01-11: 1000 mg via ORAL
  Filled 2015-01-11: qty 2

## 2015-01-11 MED ORDER — CEPHALEXIN 500 MG PO CAPS: 500.0000 mg | ORAL_CAPSULE | Freq: Four times a day (QID) | ORAL | Status: DC

## 2015-01-11 MED ORDER — IBUPROFEN 800 MG PO TABS
800.0000 mg | ORAL_TABLET | Freq: Once | ORAL | Status: AC
  Administered 2015-01-11: 800 mg via ORAL
  Filled 2015-01-11: qty 1

## 2015-01-11 MED ORDER — MUPIROCIN CALCIUM 2 % NA OINT: TOPICAL_OINTMENT | NASAL | Status: DC

## 2015-01-11 NOTE — ED Provider Notes (Signed)
CSN: [id number]     Arrival date & time 01/11/15  1346 History   First MD Initiated Contact with Patient 01/11/15 1402     Chief Complaint  Patient presents with  . Cough     (Consider location/radiation/quality/duration/timing/severity/associated sxs/prior Treatment) Patient is a 59 y.o. female presenting with cough. The history is provided by the patient.  Cough Cough characteristics:  Non-productive Severity:  Moderate Onset quality:  Gradual Duration:  4 weeks Timing:  Constant Progression:  Worsening Chronicity:  New Relieved by:  Nothing Worsened by:  Nothing tried Ineffective treatments:  None tried Associated symptoms: no chest pain, no chills, no fever, no headaches, no myalgias, no rhinorrhea, no shortness of breath and no wheezing   Associated symptoms comment:  Rash around the mouth   59 yo F with a chief complaint of cough as well as a rash around the mouth. This been going on for about a month. Patient states that she has had some mild dizziness associated with it. Denies fevers or chills. Denies herpes exposure. Patient having some mild postnasal drip as well.  Past Medical History  Diagnosis Date  . Fibromyalgia   . Tendinitis   . Urinary tract infection   . Gastroesophageal reflux disease   . Helicobacter pylori (H. pylori)      treated   Past Surgical History  Procedure Laterality Date  . Partial hysterectomy    . Foot surgery      left  . Cesarean section      x 2   History reviewed. No pertinent family history. Social History  Substance Use Topics  . Smoking status: Never Smoker   . Smokeless tobacco: None  . Alcohol Use: No   OB History    No data available     Review of Systems  Constitutional: Negative for fever and chills.  HENT: Negative for congestion and rhinorrhea.   Eyes: Negative for redness and visual disturbance.  Respiratory: Positive for cough. Negative for shortness of breath and wheezing.   Cardiovascular: Negative for  chest pain and palpitations.  Gastrointestinal: Negative for nausea and vomiting.  Genitourinary: Negative for dysuria and urgency.  Musculoskeletal: Negative for myalgias and arthralgias.  Skin: Negative for pallor and wound.  Neurological: Positive for dizziness. Negative for headaches.      Allergies  Penicillins and Prednisone  Home Medications   Prior to Admission medications   Medication Sig Start Date End Date Taking? Authorizing Provider  cephALEXin (KEFLEX) 500 MG capsule Take 1 capsule (500 mg total) by mouth 4 (four) times daily. 01/11/15   Deno Etienne, DO  chlorpheniramine-HYDROcodone (TUSSIONEX PENNKINETIC ER) 10-8 MG/5ML LQCR Take 5 mLs by mouth every 12 (twelve) hours as needed. 05/02/12   Glendell Docker, NP  mupirocin nasal ointment (BACTROBAN) 2 % Apply to affected area bid 01/11/15   Deno Etienne, DO  polyethylene glycol-electrolytes (NULYTELY/GOLYTELY) 420 G solution Take 4,000 mLs by mouth once. 01/11/14   Terri L Setzer, NP   BP 190/112 mmHg  Pulse 93  Temp(Src) 98 F (36.7 C) (Oral)  Resp 16  Ht 5\' 2"  (1.575 m)  Wt 210 lb (95.255 kg)  BMI 38.40 kg/m2  SpO2 100% Physical Exam  Constitutional: She is oriented to person, place, and time. She appears well-developed and well-nourished. No distress.  HENT:  Head: Normocephalic and atraumatic.  Eyes: EOM are normal. Pupils are equal, round, and reactive to light.  Neck: Normal range of motion. Neck supple.  Cardiovascular: Normal rate and regular rhythm.  Exam reveals no gallop and no friction rub.   No murmur heard. Pulmonary/Chest: Effort normal. She has no wheezes. She has no rales.  Abdominal: Soft. She exhibits no distension. There is no tenderness. There is no rebound.  Musculoskeletal: She exhibits no edema or tenderness.  Neurological: She is alert and oriented to person, place, and time.  Skin: Skin is warm and dry. Rash noted. She is not diaphoretic.  Honey crusting rash noted around the mouth.  Nonvesicular.  Psychiatric: She has a normal mood and affect. Her behavior is normal.    ED Course  Procedures (including critical care time) Labs Review Labs Reviewed  CBC WITH DIFFERENTIAL/PLATELET  BASIC METABOLIC PANEL    Imaging Review Dg Chest 2 View  01/11/2015   CLINICAL DATA:  Cough for 1 week, dizziness. Hx asthma, pcp does not consistently treat her htn.  EXAM: CHEST  2 VIEW  COMPARISON:  02/03/2015  FINDINGS: The heart size and mediastinal contours are within normal limits. Both lungs are clear. The visualized skeletal structures are unremarkable.  IMPRESSION: No active cardiopulmonary disease.   Electronically Signed   By: Nolon Nations M.D.   On: 01/11/2015 14:27   I have personally reviewed and evaluated these images and lab results as part of my medical decision-making.   EKG Interpretation None      MDM   Final diagnoses:  Cough  Impetigo    59 yo F with a chief complaint of a rash as well as a cough for about a month. Patient has not seen her family doctor for this that she is recently moved from refill. On exam rashes concerning for possible herpes versus impetigo. With some mild honey-colored crust will treat as impetigo. Chest x-ray negative for infection. Indeed by me. Patient feeling mildly lightheaded will obtain basic labs.  Labwork unremarkable.   3:25 PM:  I have discussed the diagnosis/risks/treatment options with the patient and family and believe the pt to be eligible for discharge home to follow-up with PCP. We also discussed returning to the ED immediately if new or worsening sx occur. We discussed the sx which are most concerning (e.g., sudden worsening pain, fever, inability to tolerate by mouth) that necessitate immediate return. Medications administered to the patient during their visit and any new prescriptions provided to the patient are listed below.  Medications given during this visit Medications  ibuprofen (ADVIL,MOTRIN) tablet 800 mg  (800 mg Oral Given 01/11/15 1423)  acetaminophen (TYLENOL) tablet 1,000 mg (1,000 mg Oral Given 01/11/15 1423)  cephALEXin (KEFLEX) capsule 1,000 mg (1,000 mg Oral Given 01/11/15 1431)  mupirocin ointment (BACTROBAN) 2 % ( Nasal Given 01/11/15 1433)  mupirocin cream (BACTROBAN) 2 % (  Duplicate 68/1/15 7262)    New Prescriptions   CEPHALEXIN (KEFLEX) 500 MG CAPSULE    Take 1 capsule (500 mg total) by mouth 4 (four) times daily.   MUPIROCIN NASAL OINTMENT (BACTROBAN) 2 %    Apply to affected area bid    The patient appears reasonably screen and/or stabilized for discharge and I doubt any other medical condition or other [facility] requiring further screening, evaluation, or treatment in the ED at this time prior to discharge.    Deno Etienne, DO 01/11/15 1525

## 2015-01-11 NOTE — ED Notes (Signed)
Patient reports that she is having a cough, and rash around her mouth for 1 month. The patient reports that today she started to have dizziness.

## 2015-01-11 NOTE — Discharge Instructions (Signed)
Facial Infection °You have an infection of your face. This requires special attention to help prevent serious problems. Infections in facial wounds can cause poor healing and scars. They can also spread to deeper tissues, especially around the eye. Wound and dental infections can lead to sinusitis, infection of the eye socket, and even meningitis. Permanent damage to the skin, eye, and nervous system may result if facial infections are not treated properly. With severe infections, hospital care for IV antibiotic injections may be needed if they don't respond to oral antibiotics. °Antibiotics must be taken for the full course to insure the infection is eliminated. If the infection came from a bad tooth, it may have to be extracted when the infection is under control. Warm compresses may be applied to reduce skin irritation and remove drainage. °You might need a tetanus shot now if: °· You cannot remember when your last tetanus shot was. °· You have never had a tetanus shot. °· The object that caused your wound was dirty. °If you need a tetanus shot, and you decide not to get one, there is a rare chance of getting tetanus. Sickness from tetanus can be serious. If you got a tetanus shot, your arm may swell, get red and warm to the touch at the shot site. This is common and not a problem. °SEEK IMMEDIATE MEDICAL CARE IF:  °· You have increased swelling, redness, or trouble breathing. °· You have a severe headache, dizziness, nausea, or vomiting. °· You develop problems with your eyesight. °· You have a fever. °Document Released: 05/03/2004 Document Revised: 06/18/2011 Document Reviewed: 03/26/2005 °[employer]® Patient Information ©2015 [employer]. This information is not intended to replace advice given to you by your health care provider. Make sure you discuss any questions you have with your health care provider. ° °

## 2015-01-27 ENCOUNTER — Emergency Department (HOSPITAL_BASED_OUTPATIENT_CLINIC_OR_DEPARTMENT_OTHER)
Admission: EM | Admit: 2015-01-27 | Discharge: 2015-01-27 | Disposition: A | Discharge status: Home / Self Care | Payer: Medicaid Other | Attending: Emergency Medicine | Admitting: Emergency Medicine

## 2015-01-27 ENCOUNTER — Encounter (HOSPITAL_BASED_OUTPATIENT_CLINIC_OR_DEPARTMENT_OTHER): Payer: Self-pay | Admitting: *Deleted

## 2015-01-27 DIAGNOSIS — Z8619 Personal history of other infectious and parasitic diseases: Secondary | ICD-10-CM | POA: Insufficient documentation

## 2015-01-27 DIAGNOSIS — R030 Elevated blood-pressure reading, without diagnosis of hypertension: Secondary | ICD-10-CM | POA: Insufficient documentation

## 2015-01-27 DIAGNOSIS — Z87440 Personal history of urinary (tract) infections: Secondary | ICD-10-CM | POA: Diagnosis not present

## 2015-01-27 DIAGNOSIS — Z8719 Personal history of other diseases of the digestive system: Secondary | ICD-10-CM | POA: Insufficient documentation

## 2015-01-27 DIAGNOSIS — R21 Rash and other nonspecific skin eruption: Secondary | ICD-10-CM | POA: Diagnosis present

## 2015-01-27 DIAGNOSIS — Z880 Allergy status to penicillin: Secondary | ICD-10-CM | POA: Diagnosis not present

## 2015-01-27 DIAGNOSIS — Z8739 Personal history of other diseases of the musculoskeletal system and connective tissue: Secondary | ICD-10-CM | POA: Insufficient documentation

## 2015-01-27 DIAGNOSIS — Z792 Long term (current) use of antibiotics: Secondary | ICD-10-CM | POA: Diagnosis not present

## 2015-01-27 DIAGNOSIS — IMO0001 Reserved for inherently not codable concepts without codable children: Secondary | ICD-10-CM

## 2015-01-27 NOTE — ED Notes (Signed)
Pt has a scaley, patchy rash to face, chest and bilateral arms.  She states she was dx'd with impetigo and finished her abx and her rash never got better.  Pt is touching and rubbing face and eyes and mouth and is reminded that such a rash will spread with increased contact of infected areas.  Advised patient that she needs to change her sheets and towels and try to stop touching the rash.

## 2015-01-27 NOTE — ED Notes (Signed)
Pt d/c home with family.

## 2015-01-27 NOTE — Discharge Instructions (Signed)
Please follow with your primary care doctor in the next 2 days for a check-up. They must obtain records for further management.   Please follow with your primary care doctor in the next 5 days for high blood pressure evaluation. If you do not have a primary care doctor, present to urgent care. Reduce salt intake. Seek emergency medical care for unilateral weakness, slurring, change in vision, or chest pain and shortness of breath.  Do not hesitate to return to the Emergency Department for any new, worsening or concerning symptoms.

## 2015-01-27 NOTE — ED Notes (Signed)
Rash for a month. She was seen and treated for possible impetigo with Keflex.

## 2015-01-27 NOTE — ED Provider Notes (Signed)
CSN: [id number]     Arrival date & time 01/27/15  1836 History   First MD Initiated Contact with Patient 01/27/15 1904     Chief Complaint  Patient presents with  . Rash     (Consider location/radiation/quality/duration/timing/severity/associated sxs/prior Treatment) HPI   Blood pressure 178/107, pulse 96, temperature 98.4 F (36.9 C), temperature source Oral, resp. rate 18, height 5\' 2"  (1.575 m), weight 210 lb (95.255 kg), SpO2 97 %.  Hannah Jordan is a 59 y.o. female fibromyalgia who presents today with a rash. The patient states that the rash has been present since the beginning of September. She is unsure where the rash started but thinks it may have been the central chest. She states the rash has spread to around her mouth, the mid and upper face, the arms, and hands. Denies rash on the back, abdomen, legs, or feet. Reports that the rash is painful, itchy, and that it burns. Has tried "diaper rash cream" without much relief. The symptoms associated with the rash are slightly relieved with cool water. She endorses multiple skin allergies to various lotions, creams, etc. Was seen a few week ago and treated for impetigo with Keflex and mupirocin ointment without relief. She states that the mupirocin ointment burned her face and she stopped using it. Denies fever, chills, abdominal pain, cough, SOB, chest pain, n/v/d.    Past Medical History  Diagnosis Date  . Fibromyalgia   . Tendinitis   . Urinary tract infection   . Gastroesophageal reflux disease   . Helicobacter pylori (H. pylori)      treated   Past Surgical History  Procedure Laterality Date  . Partial hysterectomy    . Foot surgery      left  . Cesarean section      x 2   No family history on file. Social History  Substance Use Topics  . Smoking status: Never Smoker   . Smokeless tobacco: None  . Alcohol Use: No   OB History    No data available     Review of Systems  10 systems reviewed and found to be  negative, except as noted in the HPI.   Allergies  Penicillins and Prednisone  Home Medications   Prior to Admission medications   Medication Sig Start Date End Date Taking? Authorizing Provider  cephALEXin (KEFLEX) 500 MG capsule Take 1 capsule (500 mg total) by mouth 4 (four) times daily. 01/11/15   Deno Etienne, DO  chlorpheniramine-HYDROcodone (TUSSIONEX PENNKINETIC ER) 10-8 MG/5ML LQCR Take 5 mLs by mouth every 12 (twelve) hours as needed. 05/02/12   Glendell Docker, NP  mupirocin nasal ointment (BACTROBAN) 2 % Apply to affected area bid 01/11/15   Deno Etienne, DO  polyethylene glycol-electrolytes (NULYTELY/GOLYTELY) 420 G solution Take 4,000 mLs by mouth once. 01/11/14   Terri L Setzer, NP   BP 178/107 mmHg  Pulse 96  Temp(Src) 98.4 F (36.9 C) (Oral)  Resp 18  Ht 5\' 2"  (1.575 m)  Wt 210 lb (95.255 kg)  BMI 38.40 kg/m2  SpO2 97% Physical Exam  Constitutional: She is oriented to person, place, and time. She appears well-developed and well-nourished. No distress.  HENT:  Head: Normocephalic.  Eyes: Conjunctivae and EOM are normal.  Cardiovascular: Normal rate.   Pulmonary/Chest: Effort normal. No stridor.  Musculoskeletal: Normal range of motion.  Neurological: She is alert and oriented to person, place, and time.  Skin: Rash noted.  1-1.5 cm raised scaled plaques to upper chest with no tenderness  palpation or discharge. Patient has erythematous maculopapular rash to face, this is significantly affecting the lips, and goes up to the forehead. Lesions spare the mucous membranes, palms and soles.  Psychiatric: She has a normal mood and affect.  Nursing note and vitals reviewed.   ED Course  Procedures (including critical care time) Labs Review Labs Reviewed - No data to display  Imaging Review No results found. I have personally reviewed and evaluated these images and lab results as part of my medical decision-making.   EKG Interpretation None      MDM   Final  diagnoses:  Rash and nonspecific skin eruption  Elevated blood pressure    Filed Vitals:   01/27/15 1855 01/27/15 1951  BP: 178/107 189/99  Pulse: 96 100  Temp: 98.4 F (36.9 C) 98.6 F (37 C)  TempSrc: Oral Oral  Resp: 18 18  Height: 5\' 2"  (1.575 m)   Weight: 210 lb (95.255 kg)   SpO2: 97% 95%     Hannah Jordan is 59 y.o. female presenting with spreading rash which started on the chest and has gone to the face, this is worsening over the course of 2 months, patient has no systemic signs of infection. She was evaluated several weeks ago and treated for impetigo with Keflex and mupirocin. Patient states that she couldn't tolerate the Bactroban because it caused burning sensation. I would like to start this patient on a prednisone burst but she states that she saw her mother take steroids chronically and did not like the effects so she is refusing this. Patient will be given referral to dermatology however she is a Medicaid patient, advised her her primary care physician will need to make the referral to dermatology. She states that she has an appointment set up for next Thursday. She continues to call the office in hopes that there will be a cancellation. We've had an extensive discussion of return precautions patient verbalizes understanding. Of advised her her blood pressure is elevated she will need to ask her primary care physician for recheck.  Evaluation does not show pathology that would require ongoing emergent intervention or inpatient treatment. Pt is hemodynamically stable and mentating appropriately. Discussed findings and plan with patient/guardian, who agrees with care plan. All questions answered. Return precautions discussed and outpatient follow up given.        Monico Blitz, PA-C 01/27/15 2059  Veryl Speak, MD 01/27/15 [phone]

## 2015-08-22 ENCOUNTER — Other Ambulatory Visit: Payer: Self-pay | Admitting: Orthopedic Surgery

## 2015-09-06 ENCOUNTER — Inpatient Hospital Stay (HOSPITAL_COMMUNITY)
Admission: RE | Admit: 2015-09-06 | Discharge: 2015-09-06 | Disposition: A | Discharge status: Home / Self Care | Payer: Medicaid Other | Source: Ambulatory Visit

## 2015-09-12 ENCOUNTER — Inpatient Hospital Stay (HOSPITAL_COMMUNITY): Admission: RE | Admit: 2015-09-12 | Payer: Medicaid Other | Source: Ambulatory Visit | Admitting: Orthopedic Surgery

## 2015-09-12 ENCOUNTER — Encounter (HOSPITAL_COMMUNITY): Admission: RE | Payer: Self-pay | Source: Ambulatory Visit

## 2015-09-12 SURGERY — ARTHROPLASTY, KNEE, TOTAL
Anesthesia: Choice | Laterality: Right

## 2015-09-14 ENCOUNTER — Other Ambulatory Visit: Payer: Self-pay | Admitting: Orthopedic Surgery

## 2015-09-28 ENCOUNTER — Other Ambulatory Visit (HOSPITAL_COMMUNITY): Payer: Self-pay | Admitting: *Deleted

## 2015-09-28 ENCOUNTER — Encounter (HOSPITAL_COMMUNITY): Payer: Self-pay

## 2015-09-28 ENCOUNTER — Encounter (HOSPITAL_COMMUNITY)
Admission: RE | Admit: 2015-09-28 | Discharge: 2015-09-28 | Disposition: A | Discharge status: Home / Self Care | Payer: Medicaid Other | Source: Ambulatory Visit | Attending: Orthopedic Surgery | Admitting: Orthopedic Surgery

## 2015-09-28 ENCOUNTER — Ambulatory Visit (HOSPITAL_COMMUNITY)
Admission: RE | Admit: 2015-09-28 | Discharge: 2015-09-28 | Disposition: A | Discharge status: Home / Self Care | Payer: Medicaid Other | Source: Ambulatory Visit | Attending: Orthopedic Surgery | Admitting: Orthopedic Surgery

## 2015-09-28 DIAGNOSIS — R9431 Abnormal electrocardiogram [ECG] [EKG]: Secondary | ICD-10-CM | POA: Insufficient documentation

## 2015-09-28 DIAGNOSIS — I10 Essential (primary) hypertension: Secondary | ICD-10-CM | POA: Diagnosis not present

## 2015-09-28 DIAGNOSIS — Z01810 Encounter for preprocedural cardiovascular examination: Secondary | ICD-10-CM | POA: Diagnosis not present

## 2015-09-28 DIAGNOSIS — Z01818 Encounter for other preprocedural examination: Secondary | ICD-10-CM | POA: Diagnosis present

## 2015-09-28 DIAGNOSIS — Z01812 Encounter for preprocedural laboratory examination: Secondary | ICD-10-CM | POA: Diagnosis not present

## 2015-09-28 HISTORY — DX: Headache: R51

## 2015-09-28 HISTORY — DX: Sleep apnea, unspecified: G47.30

## 2015-09-28 HISTORY — DX: Diverticulitis of intestine, part unspecified, without perforation or abscess without bleeding: K57.92

## 2015-09-28 HISTORY — DX: Depression, unspecified: F32.A

## 2015-09-28 HISTORY — DX: Adverse effect of unspecified anesthetic, initial encounter: T41.45XA

## 2015-09-28 HISTORY — DX: Headache, unspecified: R51.9

## 2015-09-28 HISTORY — DX: Unspecified asthma, uncomplicated: J45.909

## 2015-09-28 HISTORY — DX: Other complications of anesthesia, initial encounter: T88.59XA

## 2015-09-28 HISTORY — DX: Unspecified osteoarthritis, unspecified site: M19.90

## 2015-09-28 HISTORY — DX: Anxiety disorder, unspecified: F41.9

## 2015-09-28 HISTORY — DX: Essential (primary) hypertension: I10

## 2015-09-28 HISTORY — DX: Major depressive disorder, single episode, unspecified: F32.9

## 2015-09-28 HISTORY — DX: Restless legs syndrome: G25.81

## 2015-09-28 LAB — CBC WITH DIFFERENTIAL/PLATELET
Basophils Absolute: 0 10*3/uL (ref 0.0–0.1)
Basophils Relative: 0 %
EOS ABS: 0.2 10*3/uL (ref 0.0–0.7)
EOS PCT: 2 %
HCT: 41.9 % (ref 36.0–46.0)
Hemoglobin: 13.8 g/dL (ref 12.0–15.0)
LYMPHS ABS: 2.8 10*3/uL (ref 0.7–4.0)
LYMPHS PCT: 36 %
MCH: 28.1 pg (ref 26.0–34.0)
MCHC: 32.9 g/dL (ref 30.0–36.0)
MCV: 85.3 fL (ref 78.0–100.0)
MONO ABS: 0.4 10*3/uL (ref 0.1–1.0)
Monocytes Relative: 5 %
Neutro Abs: 4.5 10*3/uL (ref 1.7–7.7)
Neutrophils Relative %: 57 %
PLATELETS: 241 10*3/uL (ref 150–400)
RBC: 4.91 MIL/uL (ref 3.87–5.11)
RDW: 13.5 % (ref 11.5–15.5)
WBC: 7.9 10*3/uL (ref 4.0–10.5)

## 2015-09-28 LAB — COMPREHENSIVE METABOLIC PANEL
ALT: 21 U/L (ref 14–54)
ANION GAP: 12 (ref 5–15)
AST: 23 U/L (ref 15–41)
Albumin: 3.9 g/dL (ref 3.5–5.0)
Alkaline Phosphatase: 93 U/L (ref 38–126)
BUN: 20 mg/dL (ref 6–20)
CHLORIDE: 102 mmol/L (ref 101–111)
CO2: 21 mmol/L — AB (ref 22–32)
Calcium: 9.5 mg/dL (ref 8.9–10.3)
Creatinine, Ser: 1.12 mg/dL — ABNORMAL HIGH (ref 0.44–1.00)
GFR, EST NON AFRICAN AMERICAN: 52 mL/min — AB (ref >60)
Glucose, Bld: 131 mg/dL — ABNORMAL HIGH (ref 65–99)
POTASSIUM: 3.8 mmol/L (ref 3.5–5.1)
SODIUM: 135 mmol/L (ref 135–145)
Total Bilirubin: 0.3 mg/dL (ref 0.3–1.2)
Total Protein: 7.4 g/dL (ref 6.5–8.1)

## 2015-09-28 LAB — TYPE AND SCREEN
ABO/RH(D): A NEG
ANTIBODY SCREEN: NEGATIVE

## 2015-09-28 LAB — URINALYSIS, ROUTINE W REFLEX MICROSCOPIC
GLUCOSE, UA: NEGATIVE mg/dL
HGB URINE DIPSTICK: NEGATIVE
KETONES UR: 15 mg/dL — AB
LEUKOCYTES UA: NEGATIVE
Nitrite: NEGATIVE
PH: 5.5 (ref 5.0–8.0)
Protein, ur: NEGATIVE mg/dL
Specific Gravity, Urine: 1.029 (ref 1.005–1.030)

## 2015-09-28 LAB — SURGICAL PCR SCREEN
MRSA, PCR: NEGATIVE
Staphylococcus aureus: POSITIVE — AB

## 2015-09-28 LAB — ABO/RH: ABO/RH(D): A NEG

## 2015-09-28 LAB — APTT: APTT: 29 s (ref 24–37)

## 2015-09-28 LAB — PROTIME-INR
INR: 1.03 (ref 0.00–1.49)
PROTHROMBIN TIME: 13.7 s (ref 11.6–15.2)

## 2015-09-28 NOTE — Progress Notes (Signed)
Pt denies cardiac history, chest pain or sob. 

## 2015-09-28 NOTE — Progress Notes (Signed)
Mupirocin Ointment Rx called into [facility] on [address] in [place] for positive PCR of Staph. Left message on pt's voicemail with results and need to pick up Rx tonight and get started tonight.

## 2015-09-28 NOTE — Pre-Procedure Instructions (Signed)
LASHEA RUNGE  09/28/2015     Your procedure is scheduled on Monday, October 03, 2015. Please call [phone] by 8:00 AM to get time of surgery.   Report to [facility] Entrance "A" Admitting Office 2 hours prior to surgery start time.   Call this number if you have problems the morning of surgery: [phone]   Any questions prior to day of surgery, please call [phone] between 8 & 4 PM.   Remember:  Do not eat food or drink liquids after midnight Sunday, 10/02/15.  Take these medicines the morning of surgery with A SIP OF WATER: Gabapentin (Neurontin), Nexium, Albuterol (Proventil) - if needed  Stop NSAIDS (Ibuprofen, Aleve, etc.) and Herbal medications as of today.   Do not wear jewelry, make-up or nail polish.  Do not wear lotions, powders, or perfumes.  You may wear deoderant.  Do not shave 48 hours prior to surgery.   Do not bring valuables to the hospital.  [facility] is not responsible for any belongings or valuables.  Contacts, dentures or bridgework may not be worn into surgery.  Leave your suitcase in the car.  After surgery it may be brought to your room.  For patients admitted to the hospital, discharge time will be determined by your treatment team.  Special instructions:  [facility] - Preparing for Surgery  Before surgery, you can play an important role.  Because skin is not sterile, your skin needs to be as free of germs as possible.  You can reduce the number of germs on you skin by washing with CHG (chlorahexidine gluconate) soap before surgery.  CHG is an antiseptic cleaner which kills germs and bonds with the skin to continue killing germs even after washing.  Please DO NOT use if you have an allergy to CHG or antibacterial soaps.  If your skin becomes reddened/irritated stop using the CHG and inform your nurse when you arrive at Short Stay.  Do not shave (including legs and underarms) for at least 48 hours prior to the first CHG shower.  You  may shave your face.  Please follow these instructions carefully:   1.  Shower with CHG Soap the night before surgery and the                                morning of Surgery.  2.  If you choose to wash your hair, wash your hair first as usual with your       normal shampoo.  3.  After you shampoo, rinse your hair and body thoroughly to remove the                      Shampoo.  4.  Use CHG as you would any other liquid soap.  You can apply chg directly       to the skin and wash gently with scrungie or a clean washcloth.  5.  Apply the CHG Soap to your body ONLY FROM THE NECK DOWN.        Do not use on open wounds or open sores.  Avoid contact with your eyes, ears, mouth and genitals (private parts).  Wash genitals (private parts) with your normal soap.  6.  Wash thoroughly, paying special attention to the area where your surgery        will be performed.  7.  Thoroughly rinse your body with warm  water from the neck down.  8.  DO NOT shower/wash with your normal soap after using and rinsing off       the CHG Soap.  9.  Pat yourself dry with a clean towel.            10.  Wear clean pajamas.            11.  Place clean sheets on your bed the night of your first shower and do not        sleep with pets.  Day of Surgery  Do not apply any lotions the morning of surgery.  Please wear clean clothes to the hospital.   Please read over the following fact sheets that you were given. Pain Booklet, Coughing and Deep Breathing, MRSA Information and Surgical Site Infection Prevention

## 2015-09-29 NOTE — Progress Notes (Signed)
Anesthesia Chart Review:  Pt is a 60 year old female scheduled for R total knee arthroplasty on 10/03/2015 with Dr. Berenice Primas.   PMH includes:  HTN, OSA, asthma, GERD. Never smoker. BMI 40  Medications include: albuterol, losartan, nexium  Preoperative labs reviewed.    Chest x-ray 09/28/15 reviewed. Negative chest  EKG 09/28/15: NSR. Left axis deviation. Moderate voltage criteria for LVH, may be normal variant. T wave abnormality, consider anterior ischemia  Pt denied chest pain or SOB at PAT.   Reviewed case with Dr. Tobias Alexander.   If no changes, I anticipate pt can proceed with surgery as scheduled.   Willeen Cass, FNP-BC [facility] Short Stay Surgical Center/Anesthesiology Phone: [phone] 09/29/2015 1:30 PM

## 2015-10-02 NOTE — H&P (Signed)
TOTAL KNEE ADMISSION H&P  Patient is being admitted for right total knee arthroplasty.  Subjective:  Chief Complaint:right knee pain.  HPI: Hannah Jordan, 60 y.o. female, has a history of pain and functional disability in the right knee due to arthritis and has failed non-surgical conservative treatments for greater than 12 weeks to includeNSAID's and/or analgesics, corticosteriod injections, viscosupplementation injections, weight reduction as appropriate and activity modification.  Onset of symptoms was gradual, starting 5 years ago with gradually worsening course since that time. The patient noted no past surgery on the right knee(s).  Patient currently rates pain in the right knee(s) at 8 out of 10 with activity. Patient has night pain, worsening of pain with activity and weight bearing, pain that interferes with activities of daily living, pain with passive range of motion and joint swelling.  Patient has evidence of subchondral cysts, subchondral sclerosis, joint subluxation and joint space narrowing by imaging studies. This patient has had failure of all reasonable conservative care. There is no active infection.  There are no active problems to display for this patient.  Past Medical History  Diagnosis Date  . Fibromyalgia   . Tendinitis   . Urinary tract infection   . Gastroesophageal reflux disease   . Helicobacter pylori (H. pylori)      treated  . Asthma   . Hypertension   . Sleep apnea     no cpap use  . Depression   . Anxiety   . Headache     "years ago"  . Restless leg syndrome   . Arthritis   . Diverticulitis   . Complication of anesthesia     severe headache after c-sections    Past Surgical History  Procedure Laterality Date  . Partial hysterectomy    . Foot surgery      left  . Cesarean section      x 2    No prescriptions prior to admission   Allergies  Allergen Reactions  . Penicillins Itching, Swelling and Other (See Comments)    Has patient had  a PCN reaction causing immediate rash, facial/tongue/throat swelling, SOB or lightheadedness with hypotension: YES Has patient had a PCN reaction causing severe rash involving mucus membranes or skin necrosis: NO Has patient had a PCN reaction that required hospitalization  YES Has patient had a PCN reaction occurring within the last 10 years: YES   . Prednisone Other (See Comments)    NO REACTION PT REFUSES    Social History  Substance Use Topics  . Smoking status: Never Smoker   . Smokeless tobacco: Never Used  . Alcohol Use: No    Family History  Problem Relation Age of Onset  . Hypertension Mother   . Diabetes Father   . Cancer Father      ROS ROS: I have reviewed the patient's review of systems thoroughly and there are no positive responses as relates to the HPI. Objective:  Physical Exam  Vital signs in last 24 hours:   Well-developed well-nourished patient in no acute distress. Alert and oriented x3 HEENT:within normal limits Cardiac: Regular rate and rhythm Pulmonary: Lungs clear to auscultation Abdomen: Soft and nontender.  Normal active bowel sounds  Musculoskeletal: (right knee: Limited range of motion.  Trace effusion.  No instability.  Grinding and pain to range of motion.  Neurovascularly intact distally.  Range of motion 5-95. Labs: Recent Results (from the past 2160 hour(s))  Surgical pcr screen     Status: Abnormal   Collection  Time: 09/28/15  3:26 PM  Result Value Ref Range   MRSA, PCR NEGATIVE NEGATIVE   Staphylococcus aureus POSITIVE (A) NEGATIVE    Comment:        The Xpert SA Assay (FDA approved for NASAL specimens in patients over 43 years of age), is one component of a comprehensive surveillance program.  Test performance has been validated by [facility] for patients greater than or equal to 12 year old. It is not intended to diagnose infection nor to guide or monitor treatment.   APTT     Status: None   Collection Time: 09/28/15   3:27 PM  Result Value Ref Range   aPTT 29 24 - 37 seconds  CBC WITH DIFFERENTIAL     Status: None   Collection Time: 09/28/15  3:27 PM  Result Value Ref Range   WBC 7.9 4.0 - 10.5 K/uL   RBC 4.91 3.87 - 5.11 MIL/uL   Hemoglobin 13.8 12.0 - 15.0 g/dL   HCT 41.9 36.0 - 46.0 %   MCV 85.3 78.0 - 100.0 fL   MCH 28.1 26.0 - 34.0 pg   MCHC 32.9 30.0 - 36.0 g/dL   RDW 13.5 11.5 - 15.5 %   Platelets 241 150 - 400 K/uL   Neutrophils Relative % 57 %   Neutro Abs 4.5 1.7 - 7.7 K/uL   Lymphocytes Relative 36 %   Lymphs Abs 2.8 0.7 - 4.0 K/uL   Monocytes Relative 5 %   Monocytes Absolute 0.4 0.1 - 1.0 K/uL   Eosinophils Relative 2 %   Eosinophils Absolute 0.2 0.0 - 0.7 K/uL   Basophils Relative 0 %   Basophils Absolute 0.0 0.0 - 0.1 K/uL  Comprehensive metabolic panel     Status: Abnormal   Collection Time: 09/28/15  3:27 PM  Result Value Ref Range   Sodium 135 135 - 145 mmol/L   Potassium 3.8 3.5 - 5.1 mmol/L   Chloride 102 101 - 111 mmol/L   CO2 21 (L) 22 - 32 mmol/L   Glucose, Bld 131 (H) 65 - 99 mg/dL   BUN 20 6 - 20 mg/dL   Creatinine, Ser 1.12 (H) 0.44 - 1.00 mg/dL   Calcium 9.5 8.9 - 10.3 mg/dL   Total Protein 7.4 6.5 - 8.1 g/dL   Albumin 3.9 3.5 - 5.0 g/dL   AST 23 15 - 41 U/L   ALT 21 14 - 54 U/L   Alkaline Phosphatase 93 38 - 126 U/L   Total Bilirubin 0.3 0.3 - 1.2 mg/dL   GFR calc non Af Amer 52 (L) >60 mL/min   GFR calc Af Amer >60 >60 mL/min    Comment: (NOTE) The eGFR has been calculated using the CKD EPI equation. This calculation has not been validated in all clinical situations. eGFR's persistently <60 mL/min signify possible Chronic Kidney Disease.    Anion gap 12 5 - 15  Protime-INR     Status: None   Collection Time: 09/28/15  3:27 PM  Result Value Ref Range   Prothrombin Time 13.7 11.6 - 15.2 seconds   INR 1.03 0.00 - 1.49  Urinalysis, Routine w reflex microscopic (not at [facility])     Status: Abnormal   Collection Time: 09/28/15  3:27 PM  Result Value Ref  Range   Color, Urine AMBER (A) YELLOW    Comment: BIOCHEMICALS MAY BE AFFECTED BY COLOR   APPearance CLOUDY (A) CLEAR   Specific Gravity, Urine 1.029 1.005 - 1.030   pH 5.5  5.0 - 8.0   Glucose, UA NEGATIVE NEGATIVE mg/dL   Hgb urine dipstick NEGATIVE NEGATIVE   Bilirubin Urine SMALL (A) NEGATIVE   Ketones, ur 15 (A) NEGATIVE mg/dL   Protein, ur NEGATIVE NEGATIVE mg/dL   Nitrite NEGATIVE NEGATIVE   Leukocytes, UA NEGATIVE NEGATIVE    Comment: MICROSCOPIC NOT DONE ON URINES WITH NEGATIVE PROTEIN, BLOOD, LEUKOCYTES, NITRITE, OR GLUCOSE <1000 mg/dL.  Type and screen Order type and screen if day of surgery is less than 15 days from draw of preadmission visit or order morning of surgery if day of surgery is greater than 6 days from preadmission visit.     Status: None   Collection Time: 09/28/15  3:42 PM  Result Value Ref Range   ABO/RH(D) A NEG    Antibody Screen NEG    Sample Expiration 10/12/2015    Extend sample reason NO TRANSFUSIONS OR PREGNANCY IN THE PAST 3 MONTHS   ABO/Rh     Status: None   Collection Time: 09/28/15  3:42 PM  Result Value Ref Range   ABO/RH(D) A NEG     Estimated body mass index is 38.40 kg/(m^2) as calculated from the following:   Height as of 01/27/15: 5' 2" (1.575 m).   Weight as of 01/27/15: 95.255 kg (210 lb).   Imaging Review Plain radiographs demonstrate severe degenerative joint disease of the right knee(s). The overall alignment ismild varus. The bone quality appears to be fair for age and reported activity level.  Assessment/Plan:  End stage arthritis, right knee   The patient history, physical examination, clinical judgment of the provider and imaging studies are consistent with end stage degenerative joint disease of the right knee(s) and total knee arthroplasty is deemed medically necessary. The treatment options including medical management, injection therapy arthroscopy and arthroplasty were discussed at length. The risks and benefits of total  knee arthroplasty were presented and reviewed. The risks due to aseptic loosening, infection, stiffness, patella tracking problems, thromboembolic complications and other imponderables were discussed. The patient acknowledged the explanation, agreed to proceed with the plan and consent was signed. Patient is being admitted for inpatient treatment for surgery, pain control, PT, OT, prophylactic antibiotics, VTE prophylaxis, progressive ambulation and ADL's and discharge planning. The patient is planning to be discharged home with home health services

## 2015-10-03 ENCOUNTER — Encounter (HOSPITAL_COMMUNITY)
Admission: RE | Disposition: A | Discharge status: Home / Self Care | Payer: Self-pay | Source: Ambulatory Visit | Attending: Orthopedic Surgery

## 2015-10-03 ENCOUNTER — Encounter (HOSPITAL_COMMUNITY): Payer: Self-pay | Admitting: Urology

## 2015-10-03 ENCOUNTER — Inpatient Hospital Stay (HOSPITAL_COMMUNITY): Payer: Medicaid Other | Admitting: Anesthesiology

## 2015-10-03 ENCOUNTER — Inpatient Hospital Stay (HOSPITAL_COMMUNITY)
Admission: RE | Admit: 2015-10-03 | Discharge: 2015-10-05 | DRG: 470 | Disposition: A | Discharge status: Home Health | Payer: Medicaid Other | Source: Ambulatory Visit | Attending: Orthopedic Surgery | Admitting: Orthopedic Surgery

## 2015-10-03 ENCOUNTER — Inpatient Hospital Stay (HOSPITAL_COMMUNITY): Payer: Medicaid Other | Admitting: Emergency Medicine

## 2015-10-03 DIAGNOSIS — Z880 Allergy status to penicillin: Secondary | ICD-10-CM | POA: Diagnosis not present

## 2015-10-03 DIAGNOSIS — Z888 Allergy status to other drugs, medicaments and biological substances status: Secondary | ICD-10-CM

## 2015-10-03 DIAGNOSIS — J45909 Unspecified asthma, uncomplicated: Secondary | ICD-10-CM | POA: Diagnosis present

## 2015-10-03 DIAGNOSIS — I10 Essential (primary) hypertension: Secondary | ICD-10-CM | POA: Diagnosis present

## 2015-10-03 DIAGNOSIS — M1711 Unilateral primary osteoarthritis, right knee: Secondary | ICD-10-CM | POA: Diagnosis present

## 2015-10-03 DIAGNOSIS — M797 Fibromyalgia: Secondary | ICD-10-CM | POA: Diagnosis present

## 2015-10-03 DIAGNOSIS — Z90710 Acquired absence of both cervix and uterus: Secondary | ICD-10-CM | POA: Diagnosis not present

## 2015-10-03 DIAGNOSIS — K219 Gastro-esophageal reflux disease without esophagitis: Secondary | ICD-10-CM | POA: Diagnosis present

## 2015-10-03 HISTORY — PX: TOTAL KNEE ARTHROPLASTY: SHX125

## 2015-10-03 SURGERY — ARTHROPLASTY, KNEE, TOTAL
Anesthesia: Monitor Anesthesia Care | Site: Knee | Laterality: Right

## 2015-10-03 MED ORDER — ASPIRIN EC 325 MG PO TBEC: 325.0000 mg | DELAYED_RELEASE_TABLET | Freq: Two times a day (BID) | ORAL | Status: DC

## 2015-10-03 MED ORDER — METHOCARBAMOL 1000 MG/10ML IJ SOLN
500.0000 mg | Freq: Four times a day (QID) | INTRAVENOUS | Status: DC | PRN
  Filled 2015-10-03: qty 5

## 2015-10-03 MED ORDER — PANTOPRAZOLE SODIUM 40 MG PO TBEC
80.0000 mg | DELAYED_RELEASE_TABLET | Freq: Every day | ORAL | Status: DC
  Filled 2015-10-03: qty 2

## 2015-10-03 MED ORDER — WHITE PETROLATUM GEL
Status: AC
  Filled 2015-10-03: qty 1

## 2015-10-03 MED ORDER — FENTANYL CITRATE (PF) 250 MCG/5ML IJ SOLN
INTRAMUSCULAR | Status: AC
  Filled 2015-10-03: qty 5

## 2015-10-03 MED ORDER — MIDAZOLAM HCL 5 MG/5ML IJ SOLN
INTRAMUSCULAR | Status: DC | PRN
  Administered 2015-10-03: 2 mg via INTRAVENOUS

## 2015-10-03 MED ORDER — LOSARTAN POTASSIUM 50 MG PO TABS: 50.0000 mg | ORAL_TABLET | Freq: Every day | ORAL | Status: DC | PRN

## 2015-10-03 MED ORDER — LIDOCAINE 2% (20 MG/ML) 5 ML SYRINGE
INTRAMUSCULAR | Status: AC
  Filled 2015-10-03: qty 5

## 2015-10-03 MED ORDER — MIDAZOLAM HCL 2 MG/2ML IJ SOLN
INTRAMUSCULAR | Status: AC
  Filled 2015-10-03: qty 2

## 2015-10-03 MED ORDER — HYDROMORPHONE HCL 1 MG/ML IJ SOLN
0.5000 mg | INTRAMUSCULAR | Status: DC | PRN
  Administered 2015-10-03: 0.5 mg via INTRAVENOUS
  Administered 2015-10-04 – 2015-10-05 (×2): 1 mg via INTRAVENOUS
  Filled 2015-10-03 (×3): qty 1

## 2015-10-03 MED ORDER — CLINDAMYCIN PHOSPHATE 600 MG/50ML IV SOLN
600.0000 mg | Freq: Four times a day (QID) | INTRAVENOUS | Status: AC
  Administered 2015-10-03 – 2015-10-04 (×2): 600 mg via INTRAVENOUS
  Filled 2015-10-03 (×2): qty 50

## 2015-10-03 MED ORDER — PHENYLEPHRINE HCL 10 MG/ML IJ SOLN
INTRAMUSCULAR | Status: DC | PRN
  Administered 2015-10-03 (×2): 80 ug via INTRAVENOUS

## 2015-10-03 MED ORDER — BUPIVACAINE LIPOSOME 1.3 % IJ SUSP
INTRAMUSCULAR | Status: DC | PRN
  Administered 2015-10-03: 20 mL

## 2015-10-03 MED ORDER — ACETAMINOPHEN 325 MG PO TABS: 650.0000 mg | ORAL_TABLET | Freq: Four times a day (QID) | ORAL | Status: DC | PRN

## 2015-10-03 MED ORDER — CLINDAMYCIN PHOSPHATE 900 MG/50ML IV SOLN
900.0000 mg | INTRAVENOUS | Status: AC
  Administered 2015-10-03: 900 mg via INTRAVENOUS

## 2015-10-03 MED ORDER — DIPHENHYDRAMINE HCL 12.5 MG/5ML PO ELIX
12.5000 mg | ORAL_SOLUTION | ORAL | Status: DC | PRN
  Administered 2015-10-03 – 2015-10-05 (×4): 25 mg via ORAL
  Filled 2015-10-03 (×4): qty 10

## 2015-10-03 MED ORDER — ASPIRIN EC 325 MG PO TBEC
325.0000 mg | DELAYED_RELEASE_TABLET | Freq: Two times a day (BID) | ORAL | Status: DC
  Administered 2015-10-04 – 2015-10-05 (×3): 325 mg via ORAL
  Filled 2015-10-03 (×3): qty 1

## 2015-10-03 MED ORDER — EPHEDRINE SULFATE 50 MG/ML IJ SOLN
INTRAMUSCULAR | Status: DC | PRN
  Administered 2015-10-03 (×3): 10 mg via INTRAVENOUS

## 2015-10-03 MED ORDER — 0.9 % SODIUM CHLORIDE (POUR BTL) OPTIME
TOPICAL | Status: DC | PRN
  Administered 2015-10-03: 1000 mL

## 2015-10-03 MED ORDER — TRANEXAMIC ACID 1000 MG/10ML IV SOLN
1000.0000 mg | INTRAVENOUS | Status: AC
  Administered 2015-10-03: 1000 mg via INTRAVENOUS
  Filled 2015-10-03 (×2): qty 10

## 2015-10-03 MED ORDER — PROPOFOL 10 MG/ML IV BOLUS
INTRAVENOUS | Status: AC
  Filled 2015-10-03: qty 20

## 2015-10-03 MED ORDER — OXYCODONE-ACETAMINOPHEN 5-325 MG PO TABS: 1.0000 | ORAL_TABLET | ORAL | Status: DC | PRN

## 2015-10-03 MED ORDER — DOCUSATE SODIUM 100 MG PO CAPS
100.0000 mg | ORAL_CAPSULE | Freq: Two times a day (BID) | ORAL | Status: DC
  Administered 2015-10-03 – 2015-10-05 (×4): 100 mg via ORAL
  Filled 2015-10-03 (×4): qty 1

## 2015-10-03 MED ORDER — OXYCODONE HCL 5 MG/5ML PO SOLN: 5.0000 mg | Freq: Once | ORAL | Status: AC | PRN

## 2015-10-03 MED ORDER — OXYCODONE HCL 5 MG PO TABS
5.0000 mg | ORAL_TABLET | ORAL | Status: DC | PRN
  Administered 2015-10-03 – 2015-10-05 (×6): 10 mg via ORAL
  Filled 2015-10-03 (×6): qty 2

## 2015-10-03 MED ORDER — OXYCODONE HCL 5 MG PO TABS
5.0000 mg | ORAL_TABLET | Freq: Once | ORAL | Status: AC | PRN
  Administered 2015-10-03: 5 mg via ORAL

## 2015-10-03 MED ORDER — EPHEDRINE 5 MG/ML INJ
INTRAVENOUS | Status: AC
  Filled 2015-10-03: qty 10

## 2015-10-03 MED ORDER — TRANEXAMIC ACID 1000 MG/10ML IV SOLN
1000.0000 mg | Freq: Once | INTRAVENOUS | Status: AC
  Administered 2015-10-03: 1000 mg via INTRAVENOUS
  Filled 2015-10-03: qty 10

## 2015-10-03 MED ORDER — FENTANYL CITRATE (PF) 100 MCG/2ML IJ SOLN
INTRAMUSCULAR | Status: AC
  Filled 2015-10-03: qty 2

## 2015-10-03 MED ORDER — FENTANYL CITRATE (PF) 100 MCG/2ML IJ SOLN
INTRAMUSCULAR | Status: DC | PRN
  Administered 2015-10-03: 100 ug via INTRAVENOUS

## 2015-10-03 MED ORDER — METHOCARBAMOL 500 MG PO TABS
500.0000 mg | ORAL_TABLET | Freq: Four times a day (QID) | ORAL | Status: DC | PRN
  Administered 2015-10-03 – 2015-10-05 (×4): 500 mg via ORAL
  Filled 2015-10-03 (×3): qty 1

## 2015-10-03 MED ORDER — CLINDAMYCIN PHOSPHATE 900 MG/50ML IV SOLN
INTRAVENOUS | Status: AC
  Filled 2015-10-03: qty 50

## 2015-10-03 MED ORDER — MIDAZOLAM HCL 2 MG/2ML IJ SOLN
2.0000 mg | Freq: Once | INTRAMUSCULAR | Status: AC
  Administered 2015-10-03: 2 mg via INTRAVENOUS

## 2015-10-03 MED ORDER — ONDANSETRON HCL 4 MG/2ML IJ SOLN
INTRAMUSCULAR | Status: DC | PRN
  Administered 2015-10-03: 4 mg via INTRAVENOUS

## 2015-10-03 MED ORDER — OXYCODONE HCL 5 MG PO TABS
ORAL_TABLET | ORAL | Status: AC
  Filled 2015-10-03: qty 1

## 2015-10-03 MED ORDER — BUPIVACAINE-EPINEPHRINE (PF) 0.25% -1:200000 IJ SOLN
INTRAMUSCULAR | Status: AC
  Filled 2015-10-03: qty 30

## 2015-10-03 MED ORDER — PROPOFOL 500 MG/50ML IV EMUL
INTRAVENOUS | Status: DC | PRN
Start: 2015-10-03 — End: 2015-10-03
  Administered 2015-10-03: 100 ug/kg/min via INTRAVENOUS

## 2015-10-03 MED ORDER — LACTATED RINGERS IV SOLN
INTRAVENOUS | Status: DC
  Administered 2015-10-03: 13:00:00 via INTRAVENOUS

## 2015-10-03 MED ORDER — PHENYLEPHRINE 40 MCG/ML (10ML) SYRINGE FOR IV PUSH (FOR BLOOD PRESSURE SUPPORT)
PREFILLED_SYRINGE | INTRAVENOUS | Status: AC
  Filled 2015-10-03: qty 10

## 2015-10-03 MED ORDER — GABAPENTIN 400 MG PO CAPS
800.0000 mg | ORAL_CAPSULE | Freq: Every day | ORAL | Status: DC
  Administered 2015-10-03 – 2015-10-04 (×2): 800 mg via ORAL
  Filled 2015-10-03 (×2): qty 2

## 2015-10-03 MED ORDER — ALUM & MAG HYDROXIDE-SIMETH 200-200-20 MG/5ML PO SUSP: 30.0000 mL | ORAL | Status: DC | PRN

## 2015-10-03 MED ORDER — BUPIVACAINE IN DEXTROSE 0.75-8.25 % IT SOLN
INTRATHECAL | Status: DC | PRN
  Administered 2015-10-03: 1.8 mL via INTRATHECAL

## 2015-10-03 MED ORDER — BISACODYL 5 MG PO TBEC: 5.0000 mg | DELAYED_RELEASE_TABLET | Freq: Every day | ORAL | Status: DC | PRN

## 2015-10-03 MED ORDER — PHENYLEPHRINE HCL 10 MG/ML IJ SOLN
10.0000 mg | INTRAVENOUS | Status: DC | PRN
  Administered 2015-10-03: 50 ug/min via INTRAVENOUS

## 2015-10-03 MED ORDER — FENTANYL CITRATE (PF) 100 MCG/2ML IJ SOLN
25.0000 ug | INTRAMUSCULAR | Status: DC | PRN
  Administered 2015-10-03 (×2): 50 ug via INTRAVENOUS

## 2015-10-03 MED ORDER — ONDANSETRON HCL 4 MG/2ML IJ SOLN
INTRAMUSCULAR | Status: AC
  Filled 2015-10-03: qty 2

## 2015-10-03 MED ORDER — TIZANIDINE HCL 2 MG PO TABS: 2.0000 mg | ORAL_TABLET | Freq: Three times a day (TID) | ORAL | Status: DC | PRN

## 2015-10-03 MED ORDER — ONDANSETRON HCL 4 MG/2ML IJ SOLN: 4.0000 mg | Freq: Four times a day (QID) | INTRAMUSCULAR | Status: DC | PRN

## 2015-10-03 MED ORDER — BUPIVACAINE-EPINEPHRINE (PF) 0.25% -1:200000 IJ SOLN
INTRAMUSCULAR | Status: DC | PRN
  Administered 2015-10-03: 20 mL via PERINEURAL

## 2015-10-03 MED ORDER — MAGNESIUM CITRATE PO SOLN: 1.0000 | Freq: Once | ORAL | Status: DC | PRN

## 2015-10-03 MED ORDER — CHLORHEXIDINE GLUCONATE 4 % EX LIQD: 60.0000 mL | Freq: Once | CUTANEOUS | Status: DC

## 2015-10-03 MED ORDER — KETOROLAC TROMETHAMINE 15 MG/ML IJ SOLN
15.0000 mg | Freq: Three times a day (TID) | INTRAMUSCULAR | Status: AC
  Administered 2015-10-03 – 2015-10-04 (×4): 15 mg via INTRAVENOUS
  Filled 2015-10-03 (×4): qty 1

## 2015-10-03 MED ORDER — ALBUTEROL SULFATE 2 MG PO TABS: 2.0000 mg | ORAL_TABLET | Freq: Three times a day (TID) | ORAL | Status: DC | PRN

## 2015-10-03 MED ORDER — POLYETHYLENE GLYCOL 3350 17 G PO PACK: 17.0000 g | PACK | Freq: Every day | ORAL | Status: DC | PRN

## 2015-10-03 MED ORDER — BUPIVACAINE LIPOSOME 1.3 % IJ SUSP
20.0000 mL | Freq: Once | INTRAMUSCULAR | Status: DC
  Filled 2015-10-03: qty 20

## 2015-10-03 MED ORDER — SODIUM CHLORIDE 0.9 % IV SOLN
INTRAVENOUS | Status: DC
  Administered 2015-10-03 – 2015-10-04 (×2): via INTRAVENOUS

## 2015-10-03 MED ORDER — LACTATED RINGERS IV SOLN
INTRAVENOUS | Status: DC | PRN
  Administered 2015-10-03 (×2): via INTRAVENOUS

## 2015-10-03 MED ORDER — ONDANSETRON HCL 4 MG PO TABS: 4.0000 mg | ORAL_TABLET | Freq: Four times a day (QID) | ORAL | Status: DC | PRN

## 2015-10-03 MED ORDER — METHOCARBAMOL 500 MG PO TABS
ORAL_TABLET | ORAL | Status: AC
  Filled 2015-10-03: qty 1

## 2015-10-03 MED ORDER — ZOLPIDEM TARTRATE 5 MG PO TABS
5.0000 mg | ORAL_TABLET | Freq: Every evening | ORAL | Status: DC | PRN
  Administered 2015-10-03: 5 mg via ORAL
  Filled 2015-10-03: qty 1

## 2015-10-03 MED ORDER — ACETAMINOPHEN 650 MG RE SUPP: 650.0000 mg | Freq: Four times a day (QID) | RECTAL | Status: DC | PRN

## 2015-10-03 MED ORDER — GABAPENTIN 400 MG PO CAPS
400.0000 mg | ORAL_CAPSULE | Freq: Three times a day (TID) | ORAL | Status: DC
  Administered 2015-10-04 – 2015-10-05 (×4): 400 mg via ORAL
  Filled 2015-10-03 (×5): qty 1

## 2015-10-03 SURGICAL SUPPLY — 74 items
APL SKNCLS STERI-STRIP NONHPOA (GAUZE/BANDAGES/DRESSINGS) ×1
BANDAGE ACE 4X5 VEL STRL LF (GAUZE/BANDAGES/DRESSINGS) ×2 IMPLANT
BANDAGE ACE 6X5 VEL STRL LF (GAUZE/BANDAGES/DRESSINGS) ×2 IMPLANT
BANDAGE ESMARK 6X9 LF (GAUZE/BANDAGES/DRESSINGS) ×1 IMPLANT
BENZOIN TINCTURE PRP APPL 2/3 (GAUZE/BANDAGES/DRESSINGS) ×3 IMPLANT
BLADE SAGITTAL 25.0X1.19X90 (BLADE) ×2 IMPLANT
BLADE SAGITTAL 25.0X1.19X90MM (BLADE) ×1
BLADE SAW SAG 90X13X1.27 (BLADE) ×3 IMPLANT
BNDG CMPR 9X6 STRL LF SNTH (GAUZE/BANDAGES/DRESSINGS) ×1
BNDG ESMARK 6X9 LF (GAUZE/BANDAGES/DRESSINGS) ×3
BOWL SMART MIX CTS (DISPOSABLE) ×3 IMPLANT
CAP KNEE TOTAL 3 SIGMA ×2 IMPLANT
CEMENT HV SMART SET (Cement) ×6 IMPLANT
CLOSURE STERI-STRIP 1/2X4 (GAUZE/BANDAGES/DRESSINGS) ×1
CLOSURE WOUND 1/2 X4 (GAUZE/BANDAGES/DRESSINGS) ×2
CLSR STERI-STRIP ANTIMIC 1/2X4 (GAUZE/BANDAGES/DRESSINGS) ×1 IMPLANT
COVER SURGICAL LIGHT HANDLE (MISCELLANEOUS) ×3 IMPLANT
CUFF TOURNIQUET SINGLE 34IN LL (TOURNIQUET CUFF) ×3 IMPLANT
CUFF TOURNIQUET SINGLE 44IN (TOURNIQUET CUFF) IMPLANT
DRAIN CHANNEL 10F 3/8 F FF (DRAIN) ×2 IMPLANT
DRAPE EXTREMITY T 121X128X90 (DRAPE) ×3 IMPLANT
DRAPE IMP U-DRAPE 54X76 (DRAPES) ×3 IMPLANT
DRAPE U-SHAPE 47X51 STRL (DRAPES) ×3 IMPLANT
DRSG AQUACEL AG ADV 3.5X10 (GAUZE/BANDAGES/DRESSINGS) ×2 IMPLANT
DRSG MEPILEX BORDER 4X12 (GAUZE/BANDAGES/DRESSINGS) ×3 IMPLANT
DRSG PAD ABDOMINAL 8X10 ST (GAUZE/BANDAGES/DRESSINGS) ×3 IMPLANT
DURAPREP 26ML APPLICATOR (WOUND CARE) ×3 IMPLANT
ELECT REM PT RETURN 9FT ADLT (ELECTROSURGICAL) ×3
ELECTRODE REM PT RTRN 9FT ADLT (ELECTROSURGICAL) ×1 IMPLANT
EVACUATOR 1/8 PVC DRAIN (DRAIN) ×3 IMPLANT
FACESHIELD WRAPAROUND (MASK) ×3 IMPLANT
FACESHIELD WRAPAROUND OR TEAM (MASK) ×1 IMPLANT
GAUZE SPONGE 4X4 12PLY STRL (GAUZE/BANDAGES/DRESSINGS) ×3 IMPLANT
GLOVE BIOGEL PI IND STRL 8 (GLOVE) ×2 IMPLANT
GLOVE BIOGEL PI INDICATOR 8 (GLOVE) ×4
GLOVE ECLIPSE 7.5 STRL STRAW (GLOVE) ×6 IMPLANT
GOWN STRL REUS W/ TWL LRG LVL3 (GOWN DISPOSABLE) ×1 IMPLANT
GOWN STRL REUS W/ TWL XL LVL3 (GOWN DISPOSABLE) ×2 IMPLANT
GOWN STRL REUS W/TWL LRG LVL3 (GOWN DISPOSABLE) ×3
GOWN STRL REUS W/TWL XL LVL3 (GOWN DISPOSABLE) ×6
HANDPIECE INTERPULSE COAX TIP (DISPOSABLE) ×3
HOOD PEEL AWAY FACE SHEILD DIS (HOOD) ×9 IMPLANT
IMMOBILIZER KNEE 20 (SOFTGOODS) IMPLANT
IMMOBILIZER KNEE 22 UNIV (SOFTGOODS) ×5 IMPLANT
KIT BASIN OR (CUSTOM PROCEDURE TRAY) ×3 IMPLANT
KIT ROOM TURNOVER OR (KITS) ×3 IMPLANT
MANIFOLD NEPTUNE II (INSTRUMENTS) ×3 IMPLANT
NDL SPNL 22GX3.5 QUINCKE BK (NEEDLE) ×1 IMPLANT
NEEDLE SPNL 22GX3.5 QUINCKE BK (NEEDLE) ×3 IMPLANT
NS IRRIG 1000ML POUR BTL (IV SOLUTION) ×3 IMPLANT
PACK TOTAL JOINT (CUSTOM PROCEDURE TRAY) ×3 IMPLANT
PACK UNIVERSAL I (CUSTOM PROCEDURE TRAY) ×3 IMPLANT
PAD ABD 8X10 STRL (GAUZE/BANDAGES/DRESSINGS) ×2 IMPLANT
PAD ARMBOARD 7.5X6 YLW CONV (MISCELLANEOUS) ×6 IMPLANT
PAD CAST 4YDX4 CTTN HI CHSV (CAST SUPPLIES) ×1 IMPLANT
PADDING CAST COTTON 4X4 STRL (CAST SUPPLIES) ×3
PADDING CAST COTTON 6X4 STRL (CAST SUPPLIES) ×2 IMPLANT
SET HNDPC FAN SPRY TIP SCT (DISPOSABLE) ×1 IMPLANT
SPONGE GAUZE 4X4 12PLY STER LF (GAUZE/BANDAGES/DRESSINGS) ×2 IMPLANT
STAPLER VISISTAT 35W (STAPLE) IMPLANT
STRIP CLOSURE SKIN 1/2X4 (GAUZE/BANDAGES/DRESSINGS) ×4 IMPLANT
SUCTION FRAZIER HANDLE 10FR (MISCELLANEOUS) ×2
SUCTION TUBE FRAZIER 10FR DISP (MISCELLANEOUS) ×1 IMPLANT
SUT MNCRL AB 3-0 PS2 18 (SUTURE) IMPLANT
SUT VIC AB 0 CTB1 27 (SUTURE) ×6 IMPLANT
SUT VIC AB 1 CT1 27 (SUTURE) ×9
SUT VIC AB 1 CT1 27XBRD ANBCTR (SUTURE) ×2 IMPLANT
SUT VIC AB 2-0 CTB1 (SUTURE) ×6 IMPLANT
SYR 50ML LL SCALE MARK (SYRINGE) ×3 IMPLANT
TOWEL OR 17X24 6PK STRL BLUE (TOWEL DISPOSABLE) ×3 IMPLANT
TOWEL OR 17X26 10 PK STRL BLUE (TOWEL DISPOSABLE) ×3 IMPLANT
TRAY FOLEY CATH 16FRSI W/METER (SET/KITS/TRAYS/PACK) IMPLANT
UPCHARGE REV TRAY MBT KNEE ×2 IMPLANT
WRAP KNEE MAXI GEL POST OP (GAUZE/BANDAGES/DRESSINGS) ×5 IMPLANT

## 2015-10-03 NOTE — Brief Op Note (Signed)
10/03/2015  6:11 PM  PATIENT:  Hannah Jordan  60 y.o. female  PRE-OPERATIVE DIAGNOSIS:  Osteoarthritis right knee   POST-OPERATIVE DIAGNOSIS:  Osteoarthritis right knee   PROCEDURE:  Procedure(s): RIGHT TOTAL KNEE ARTHROPLASTY (Right)  SURGEON:  Surgeon(s) and Role:    * Dorna Leitz, MD - Primary  PHYSICIAN ASSISTANT:   ASSISTANTS: bethune   ANESTHESIA:   general  EBL:     BLOOD ADMINISTERED:none  DRAINS: none   LOCAL MEDICATIONS USED:  MARCAINE    and OTHER experel  SPECIMEN:  No Specimen  DISPOSITION OF SPECIMEN:  N/A  COUNTS:  YES  TOURNIQUET:  * Missing tourniquet times found for documented tourniquets in log:  [id number] *  DICTATION: .Other Dictation: Dictation Number [id number]  PLAN OF CARE: Admit to inpatient   PATIENT DISPOSITION:  PACU - hemodynamically stable.   Delay start of Pharmacological VTE agent (>24hrs) due to surgical blood loss or risk of bleeding: no

## 2015-10-03 NOTE — Anesthesia Preprocedure Evaluation (Signed)
Anesthesia Evaluation  Patient identified by MRN, date of birth, ID band Patient awake    Reviewed: Allergy & Precautions, H&P , NPO status , Patient's Chart, lab work & pertinent test results  Airway Mallampati: II   Neck ROM: full    Dental   Pulmonary asthma , sleep apnea ,    breath sounds clear to auscultation       Cardiovascular hypertension,  Rhythm:regular Rate:Normal     Neuro/Psych  Headaches, Anxiety Depression  Neuromuscular disease    GI/Hepatic GERD  ,  Endo/Other  Morbid obesity  Renal/GU      Musculoskeletal  (+) Arthritis , Fibromyalgia -  Abdominal   Peds  Hematology   Anesthesia Other Findings   Reproductive/Obstetrics                             Anesthesia Physical Anesthesia Plan  ASA: II  Anesthesia Plan: MAC and Spinal   Post-op Pain Management:    Induction: Intravenous  Airway Management Planned: Simple Face Mask  Additional Equipment:   Intra-op Plan:   Post-operative Plan:   Informed Consent: I have reviewed the patients History and Physical, chart, labs and discussed the procedure including the risks, benefits and alternatives for the proposed anesthesia with the patient or authorized representative who has indicated his/her understanding and acceptance.     Plan Discussed with: CRNA, Anesthesiologist and Surgeon  Anesthesia Plan Comments:         Anesthesia Quick Evaluation

## 2015-10-03 NOTE — Anesthesia Procedure Notes (Signed)
Spinal Patient location during procedure: OR Start time: 10/03/2015 5:01 PM End time: 10/03/2015 5:26 PM Staffing Anesthesiologist: Ercilia Bettinger Performed by: anesthesiologist  Preanesthetic Checklist Completed: patient identified, site marked, surgical consent, pre-op evaluation, timeout performed, IV checked, risks and benefits discussed and monitors and equipment checked Spinal Block Patient position: sitting Prep: Betadine Patient monitoring: heart rate, cardiac monitor, continuous pulse ox and blood pressure Approach: midline Location: L3-4 Injection technique: single-shot Needle Needle type: Pencan  Needle gauge: 24 G Needle length: 10 cm Assessment Sensory level: T6 Additional Notes Pt tolerated the procedure well.

## 2015-10-03 NOTE — Progress Notes (Signed)
Pt BP 194/121, pt anxious, tearful. Pt requesting medicine for anxiety. Spoke with Dr. Marcie Bal and new order for 2mg  Versed.

## 2015-10-03 NOTE — Progress Notes (Signed)
Orthopedic Tech Progress Note Patient Details:  Hannah Jordan [birth year] [id number]  Patient ID: Hannah Jordan, female   DOB: Feb 23, 1956, 60 y.o.   MRN: [id number] Applied ohf to bed  Hannah Jordan 10/03/2015, 9:43 PM

## 2015-10-03 NOTE — Discharge Instructions (Signed)

## 2015-10-03 NOTE — Anesthesia Postprocedure Evaluation (Signed)
Anesthesia Post Note  Patient: Hannah Jordan  Procedure(s) Performed: Procedure(s) (LRB): RIGHT TOTAL KNEE ARTHROPLASTY (Right)  Patient location during evaluation: PACU Anesthesia Type: Spinal and MAC Level of consciousness: awake and alert Pain management: pain level controlled Vital Signs Assessment: post-procedure vital signs reviewed and stable Respiratory status: spontaneous breathing and respiratory function stable Cardiovascular status: blood pressure returned to baseline and stable Postop Assessment: spinal receding Anesthetic complications: no    Last Vitals:  Filed Vitals:   10/03/15 1945 10/03/15 1955  BP: 150/83 129/85  Pulse: 58 61  Temp:    Resp: 16 17    Last Pain:  Filed Vitals:   10/03/15 1956  PainSc: 6                  Lyan Moyano,W. EDMOND

## 2015-10-03 NOTE — Progress Notes (Signed)
Orthopedic Tech Progress Note Patient Details:  Hannah Jordan 01/23/1956 [id number] Applied CPM to RLE. CPM Right Knee CPM Right Knee: On Right Knee Flexion (Degrees): 90 Right Knee Extension (Degrees): 0   Darrol Poke 10/03/2015, 7:22 PM

## 2015-10-03 NOTE — Transfer of Care (Signed)
Immediate Anesthesia Transfer of Care Note  Patient: Hannah Jordan  Procedure(s) Performed: Procedure(s): RIGHT TOTAL KNEE ARTHROPLASTY (Right)  Patient Location: PACU  Anesthesia Type:Spinal  Level of Consciousness: awake  Airway & Oxygen Therapy: Patient Spontanous Breathing and Patient connected to nasal cannula oxygen  Post-op Assessment: Report given to RN and Post -op Vital signs reviewed and stable  Post vital signs: Reviewed and stable  Last Vitals:  Filed Vitals:   10/03/15 1411 10/03/15 1418  BP:  175/96  Pulse: 70 67  Temp:    Resp: 15 11    Last Pain:  Filed Vitals:   10/03/15 1852  PainSc: 7          Complications: No apparent anesthesia complications

## 2015-10-04 ENCOUNTER — Encounter (HOSPITAL_COMMUNITY): Payer: Self-pay | Admitting: Orthopedic Surgery

## 2015-10-04 LAB — CBC
HCT: 35.3 % — ABNORMAL LOW (ref 36.0–46.0)
HEMOGLOBIN: 11.5 g/dL — AB (ref 12.0–15.0)
MCH: 28 pg (ref 26.0–34.0)
MCHC: 32.6 g/dL (ref 30.0–36.0)
MCV: 86.1 fL (ref 78.0–100.0)
Platelets: 200 10*3/uL (ref 150–400)
RBC: 4.1 MIL/uL (ref 3.87–5.11)
RDW: 13.9 % (ref 11.5–15.5)
WBC: 8 10*3/uL (ref 4.0–10.5)

## 2015-10-04 LAB — BASIC METABOLIC PANEL
Anion gap: 6 (ref 5–15)
BUN: 15 mg/dL (ref 6–20)
CHLORIDE: 106 mmol/L (ref 101–111)
CO2: 25 mmol/L (ref 22–32)
CREATININE: 1.06 mg/dL — AB (ref 0.44–1.00)
Calcium: 8.2 mg/dL — ABNORMAL LOW (ref 8.9–10.3)
GFR calc Af Amer: >60 mL/min (ref >60)
GFR calc non Af Amer: 55 mL/min — ABNORMAL LOW (ref >60)
GLUCOSE: 152 mg/dL — AB (ref 65–99)
Potassium: 3.8 mmol/L (ref 3.5–5.1)
SODIUM: 137 mmol/L (ref 135–145)

## 2015-10-04 MED ORDER — CHLORHEXIDINE GLUCONATE CLOTH 2 % EX PADS
6.0000 | MEDICATED_PAD | Freq: Every day | CUTANEOUS | Status: DC
  Administered 2015-10-04: 6 via TOPICAL

## 2015-10-04 MED ORDER — MUPIROCIN 2 % EX OINT
1.0000 "application " | TOPICAL_OINTMENT | Freq: Two times a day (BID) | CUTANEOUS | Status: DC
  Administered 2015-10-04 – 2015-10-05 (×3): 1 via NASAL
  Filled 2015-10-04 (×2): qty 22

## 2015-10-04 MED ORDER — PANTOPRAZOLE SODIUM 40 MG PO TBEC
80.0000 mg | DELAYED_RELEASE_TABLET | Freq: Every day | ORAL | Status: DC
  Administered 2015-10-04 – 2015-10-05 (×2): 80 mg via ORAL
  Filled 2015-10-04 (×4): qty 2

## 2015-10-04 NOTE — Op Note (Signed)
Hannah Jordan, Hannah Jordan NO.:  [id number]  MEDICAL RECORD NO.:  [id number]  LOCATION:  6N08C                        FACILITY:  [facility]  PHYSICIAN:  Alta Corning, M.D.   DATE OF BIRTH:  1955-08-23  DATE OF PROCEDURE:  10/03/2015 DATE OF DISCHARGE:                              OPERATIVE REPORT   PREOPERATIVE DIAGNOSIS:  End-stage degenerative joint disease, right knee.  POSTOPERATIVE DIAGNOSIS:  End-stage degenerative joint disease, right knee.  PROCEDURE:  Right total knee replacement with Sigma system, size 2.5 MBT style revision tray, size 2.5 femur, 10 mm bridging bearing, and a 35 mm all polyethylene patella.  SURGEON:  Alta Corning, M.D.  ASSISTANT:  Gary Fleet, P.A.  ANESTHESIA:  General.  BRIEF HISTORY:  Ms. Bowhay is a 60 year old female with a long history of significant complaints of right knee pain.  She had been treated conservatively for a prolonged period of time.  She had failed cortisone injection, viscosupplementation, and activity modification.  She was having night pain and light activity pain.  After failure of all conservative care, she was taken to the operating room for total knee replacement.  X-ray showed bone-on-bone change preoperatively.  DESCRIPTION OF PROCEDURE:  The patient was taken to the operating room. After adequate anesthesia was obtained with general anesthetic, the patient was placed supine on the operating table.  The right leg was then prepped and draped in usual sterile fashion.  Following this, the leg was exsanguinated.  Blood pressure tourniquet inflated to 300 mmHg. Following this, a midline incision was made in subcutaneous tissue, dissected down to the level of the extensor mechanism and a medial parapatellar arthrotomy was undertaken.  Once this was completed, the anterior and posterior cruciates were removed.  Medial and lateral meniscus, retropatellar fat pad, and the synovium on the anterior  aspect of the femur.  Once this was completed, attention was turned to the femur where an intramedullary pilot hole was drilled and the distal femur was resected with a 5-degree valgus inclination and 10 mm of distal bone.  Once this was resected, attention was turned towards sizing the femur, sized to a 2.5, anterior and posterior cuts were made, chamfers and box.  A 3-degree external rotation alignment was chosen for the femur and this was synced up to the intercondylar axis.  It was perfectly parallel to this.  Attention was then turned to the tibia, it was sized to a 2.5, it was drilled and keeled an MBT style revision tray.  Once this was done, the trial components were put in place.  The knee was put through a range of motion.  Attention was turned to the patella, it was cut down to a level of 13 mm.  Lugs were drilled for a 35 paddle and the lugs were drilled for this and the 35 was chosen and put it through a range of motion.  Excellent stability and range of motion were achieved.  At this point, all trial components were removed. The knee was copiously and thoroughly lavaged, pulsatile lavage, irrigation and suctioned dry.  The final components were cemented into place size 2.5 MBT style revision tray, size 2.5 distal femur, 10  mm bridging bearing trial was placed, and a 35 all poly patella was placed and held with a clamp.  All excess bone cement was removed.  40 mL of 20 mL Exparel and 20 mL of 0.25% Marcaine were instilled with multiple sticks in and around and throughout the knee for better penetration and pain relief.  Once this was completed, attention was turned towards the tourniquet which was let down.  All bleeding controlled with electrocautery.  We trialed a 12.5, it was too tight.  I went back to the 10 and at that point, put the knee through a range of motion. Excellent stability was achieved, final 10 was opened and placed and then the knee was closed with Vicryl  interrupted.  The skin was closed with 0 and 2-0 Vicryl, and 3-0 Monocryl subcuticular.  Benzoin and Steri- Strips were applied.  Sterile compressive dressing was applied.  The patient was taken to the recovery room, she was noted to be in satisfactory condition.  Estimated loss of the procedure was minimal.     Alta Corning, M.D.     Corliss Skains  D:  10/03/2015  T:  10/04/2015  Job:  [id number]

## 2015-10-04 NOTE — Progress Notes (Signed)
Subjective: 1 Day Post-Op Procedure(s) (LRB): RIGHT TOTAL KNEE ARTHROPLASTY (Right) Patient reports pain as moderate.  Foley catheter removed this a.m.  Not voiding yet. Not yet out of bed as of 8:15 this a.m.  Objective: Vital signs in last 24 hours: Temp:  [97.4 F (36.3 C)-98.2 F (36.8 C)] 98.2 F (36.8 C) (06/27 0539) Pulse Rate:  [54-81] 79 (06/27 0539) Resp:  [11-20] 16 (06/27 0539) BP: (129-194)/(65-121) 130/67 mmHg (06/27 0539) SpO2:  [93 %-100 %] 93 % (06/27 0539) Weight:  [98.431 kg (217 lb)-104.9 kg (231 lb 4.2 oz)] 104.9 kg (231 lb 4.2 oz) (06/26 2039)  Intake/Output from previous day: 06/26 0701 - 06/27 0700 In: 2236.7 [P.O.:600; I.V.:1636.7] Out: 900 [Urine:850; Blood:50] Intake/Output this shift: Total I/O In: 120 [P.O.:120] Out: 75 [Urine:75]   Recent Labs  10/04/15 0428  HGB 11.5*    Recent Labs  10/04/15 0428  WBC 8.0  RBC 4.10  HCT 35.3*  PLT 200    Recent Labs  10/04/15 0428  NA 137  K 3.8  CL 106  CO2 25  BUN 15  CREATININE 1.06*  GLUCOSE 152*  CALCIUM 8.2*   No results for input(s): LABPT, INR in the last 72 hours. Right knee exam: Neurovascular intact Sensation intact distally Intact pulses distally Dorsiflexion/Plantar flexion intact Incision: dressing C/D/I Compartment soft  Assessment/Plan: 1 Day Post-Op Procedure(s) (LRB): RIGHT TOTAL KNEE ARTHROPLASTY (Right)  Plan: Continue aspirin 325 mg twice daily for DVT prophylaxis along with SCDs. Weight-bear as tolerated on right. Up with therapy Plan for discharge tomorrow  Erlene Senters 10/04/2015, 12:27 PM

## 2015-10-04 NOTE — Care Management Note (Addendum)
Case Management Note  Patient Details  Name: Hannah Jordan MRN: [id number] Date of Birth: 05-28-1955  Subjective/Objective:                    Action/Plan:  Patient has CPM ordered through [facility]  , walker and 3 in 1 through [facility] Expected Discharge Date:  10/05/15               Expected Discharge Plan:  [place]  In-House Referral:     Discharge planning Services  CM Consult  Post Acute Care Choice:  Durable Medical Equipment, Home Health Choice offered to:     DME Arranged:  3-N-1, Walker rolling DME Agency:  [place]:    [facility] Agency:     Status of Service:  In process, will continue to follow  If discussed at Long Length of Stay Meetings, dates discussed:    Additional Comments:  Hannah Favre, RN 10/04/2015, 10:37 AM

## 2015-10-04 NOTE — Evaluation (Signed)
Physical Therapy Evaluation Patient Details Name: Hannah Jordan MRN: [id number] DOB: 22-Dec-1955 Today's Date: 10/04/2015   History of Present Illness  60 y/o WF s/p R TKA (10/03/15) with PMH of fibromyalgia, UTI, tendonitis, GERD, depression, HTN  Clinical Impression  Pt admitted with above diagnosis. Pt currently with functional limitations due to the deficits listed below (see PT Problem List).  Pt will benefit from skilled PT to increase their independence and safety with mobility to allow discharge to the venue listed below.  Pt limited by dizziness and fatigue at time of eval, but moving well.  Should progress well. Pt will need stair training as she lives in 3rd floor apartment with no elevator access.  Recommend HHPT, RW, and 3-1 BSC.     Follow Up Recommendations Home health PT;Supervision - Intermittent    Equipment Recommendations  Rolling walker with 5" wheels;3in1 (PT)    Recommendations for Other Services       Precautions / Restrictions Precautions Precautions: Knee Required Braces or Orthoses: Knee Immobilizer - Right Knee Immobilizer - Right: On when out of bed or walking Restrictions Weight Bearing Restrictions: Yes RLE Weight Bearing: Weight bearing as tolerated      Mobility  Bed Mobility Overal bed mobility: Needs Assistance Bed Mobility: Supine to Sit     Supine to sit: Supervision;HOB elevated     General bed mobility comments: Able to perform without A, but with rail and HOB elevated  Transfers Overall transfer level: Needs assistance Equipment used: Rolling walker (2 wheeled) Transfers: Sit to/from Stand Sit to Stand: Supervision         General transfer comment: cues for safety  Ambulation/Gait Ambulation/Gait assistance: Min guard Ambulation Distance (Feet): 12 Feet (x2) Assistive device: Rolling walker (2 wheeled) Gait Pattern/deviations: Step-through pattern;Trunk flexed;Wide base of support     General Gait Details: Pt moves  slightly impulsively with gait but no LOB.  Limited by dizziness and fatigue which she relates is due to medication and little sleep.  Stairs            Wheelchair Mobility    Modified Rankin (Stroke Patients Only)       Balance Overall balance assessment: Needs assistance   Sitting balance-Leahy Scale: Good     Standing balance support: During functional activity Standing balance-Leahy Scale: Fair                               Pertinent Vitals/Pain Pain Assessment: 0-10 Pain Score: 8  Pain Location: R knee Pain Descriptors / Indicators: Aching;Grimacing;Operative site guarding Pain Intervention(s): RN gave pain meds during session;Repositioned;Ice applied;Limited activity within patient's tolerance;Monitored during session    [place] expects to be discharged to:: Private residence Living Arrangements: Children Available Help at Discharge: Family;Available 24 hours/day Type of Home: Apartment (3rd floor) Home Access: Stairs to enter Entrance Stairs-Rails: Left;Right;Can reach both Entrance Stairs-Number of Steps: 2 flights Home Layout: One level Home Equipment: Walker - standard      Prior Function Level of Independence: Independent               Hand Dominance        Extremity/Trunk Assessment   Upper Extremity Assessment: Overall WFL for tasks assessed           Lower Extremity Assessment: RLE deficits/detail RLE Deficits / Details: fair quad set and knee flex ~65 degrees    Cervical / Trunk Assessment: Normal  Communication  Communication: No difficulties  Cognition Arousal/Alertness: Awake/alert Behavior During Therapy: WFL for tasks assessed/performed Overall Cognitive Status: Within Functional Limits for tasks assessed                      General Comments      Exercises Total Joint Exercises Ankle Circles/Pumps: AROM;Both Quad Sets: Right;Strengthening;10 reps Heel Slides:  AAROM;Right;10 reps Straight Leg Raises: Right;AAROM;5 reps      Assessment/Plan    PT Assessment Patient needs continued PT services  PT Diagnosis Difficulty walking   PT Problem List Decreased strength;Decreased range of motion;Decreased activity tolerance;Decreased balance;Decreased mobility;Decreased knowledge of use of DME;Pain  PT Treatment Interventions DME instruction;Gait training;Stair training;Functional mobility training;Therapeutic activities;Therapeutic exercise;Balance training   PT Goals (Current goals can be found in the Care Plan section) Acute Rehab PT Goals Patient Stated Goal: Go home PT Goal Formulation: With patient Time For Goal Achievement: 10/11/15 Potential to Achieve Goals: Good    Frequency 7X/week   Barriers to discharge Inaccessible home environment Lives in 3rd floor apartment    Co-evaluation               End of Session Equipment Utilized During Treatment: Gait belt Activity Tolerance: Patient tolerated treatment well;Patient limited by fatigue Patient left: in chair;with call bell/phone within reach;with nursing/sitter in room Nurse Communication: Mobility status;Patient requests pain meds         Time: 0920 (no charge for toileting time (5 mins))-0949 PT Time Calculation (min) (ACUTE ONLY): 29 min   Charges:   PT Evaluation $PT Eval Moderate Complexity: 1 Procedure PT Treatments $Gait Training: 8-22 mins   PT G Codes:        Anes Rigel LUBECK 10/04/2015, 11:18 AM

## 2015-10-04 NOTE — Progress Notes (Signed)
Physical Therapy Treatment Patient Details Name: JAANVI MIZENER MRN: [id number] DOB: [birth year] Today's Date: 10/04/2015    History of Present Illness 60 y/o WF s/p R TKA (10/03/15) with PMH of fibromyalgia, UTI, tendonitis, GERD, depression, HTN    PT Comments    Pt able to ambulate into hallway today.  She requires cues for safety at times.  Should be ready to initiate stair training tomorrow.  She has 2 flights to enter.  Con't to recommend HHPT, 3-1 BSC, and RW.  Follow Up Recommendations  Home health PT;Supervision - Intermittent     Equipment Recommendations  Rolling walker with 5" wheels;3in1 (PT)    Recommendations for Other Services       Precautions / Restrictions Precautions Precautions: Knee Precaution Booklet Issued: Yes (comment) Required Braces or Orthoses: Knee Immobilizer - Right Knee Immobilizer - Right: On when out of bed or walking Restrictions Weight Bearing Restrictions: Yes RLE Weight Bearing: Weight bearing as tolerated    Mobility  Bed Mobility   Transfers Overall transfer level: Needs assistance Equipment used: Rolling walker (2 wheeled) Transfers: Sit to/from Stand Sit to Stand: Supervision         General transfer comment: cues for safety  Ambulation/Gait Ambulation/Gait assistance: Min guard Ambulation Distance (Feet): 115 Feet Assistive device: Rolling walker (2 wheeled) Gait Pattern/deviations: Step-through pattern;Antalgic;Wide base of support     General Gait Details: Pt moves quickly with gait and needs cues for safety. Pt reporting she feels she is a little off today.   Stairs            Wheelchair Mobility    Modified Rankin (Stroke Patients Only)       Balance Overall balance assessment: Needs assistance   Sitting balance-Leahy Scale: Good     Standing balance support: During functional activity Standing balance-Leahy Scale: Fair                      Cognition Arousal/Alertness:  Awake/alert Behavior During Therapy: WFL for tasks assessed/performed Overall Cognitive Status: Within Functional Limits for tasks assessed                      Exercises Total Joint Exercises Ankle Circles/Pumps: AROM;Both Quad Sets: Right;Strengthening;5 reps Heel Slides: AAROM;Right;5 reps  Goniometric ROM: -11 to 70    General Comments General comments (skin integrity, edema, etc.): Pt able to stand at sink and was her hands      Pertinent Vitals/Pain Pain Assessment: 0-10 Pain Score: 8  Pain Location: R knee Pain Descriptors / Indicators: Aching;Sore;Operative site guarding Pain Intervention(s): Limited activity within patient's tolerance;Monitored during session;Repositioned;Ice applied    Home Living                      Prior Function            PT Goals (current goals can now be found in the care plan section) Acute Rehab PT Goals Patient Stated Goal: Go home PT Goal Formulation: With patient Time For Goal Achievement: 10/11/15 Potential to Achieve Goals: Good Progress towards PT goals: Progressing toward goals    Frequency  7X/week    PT Plan Current plan remains appropriate    Co-evaluation             End of Session Equipment Utilized During Treatment: Gait belt Activity Tolerance: Patient tolerated treatment well Patient left: in chair;with call bell/phone within reach;Other (comment) (with lunch)     Time: [id number]  PT Time Calculation (min) (ACUTE ONLY): 22 min  Charges:  $Gait Training: 8-22 mins                    G Codes:      Sumiko Ceasar LUBECK 10/04/2015, 1:48 PM

## 2015-10-04 NOTE — Care Management Note (Signed)
Case Management Note  Patient Details  Name: Hannah Jordan MRN: [id number] Date of Birth: 08-Jul-1955  Subjective/Objective:                    Action/Plan:   Expected Discharge Date:  10/05/15               Expected Discharge Plan:  [place]  In-House Referral:     Discharge planning Services  CM Consult  Post Acute Care Choice:  Durable Medical Equipment, Home Health Choice offered to:     DME Arranged:  3-N-1, Walker rolling, CPM DME Agency:  [facility]  HH Arranged:  PT [facility] Agency:  [place]  Status of Service:  Completed, signed off  If discussed at [place] of Stay Meetings, dates discussed:    Additional Comments:  Marilu Favre, RN 10/04/2015, 11:37 AM

## 2015-10-05 LAB — CBC
HEMATOCRIT: 35.1 % — AB (ref 36.0–46.0)
HEMOGLOBIN: 11.2 g/dL — AB (ref 12.0–15.0)
MCH: 27.8 pg (ref 26.0–34.0)
MCHC: 31.9 g/dL (ref 30.0–36.0)
MCV: 87.1 fL (ref 78.0–100.0)
PLATELETS: 204 10*3/uL (ref 150–400)
RBC: 4.03 MIL/uL (ref 3.87–5.11)
RDW: 14.5 % (ref 11.5–15.5)
WBC: 10.2 10*3/uL (ref 4.0–10.5)

## 2015-10-05 MED ORDER — HYDROMORPHONE HCL 2 MG PO TABS: 2.0000 mg | ORAL_TABLET | Freq: Four times a day (QID) | ORAL | Status: DC | PRN

## 2015-10-05 NOTE — Progress Notes (Signed)
Physical Therapy Treatment Patient Details Name: Hannah Jordan MRN: [id number] DOB: Sep 11, 1955 Today's Date: 10-24-2015    History of Present Illness 60 y/o WF s/p R TKA (10/03/15) with PMH of fibromyalgia, UTI, tendonitis, GERD, depression, HTN    PT Comments    Returned to perform supine exercise to ensure correct technique at home. Pt ready for d/c.  Patient calling for nurse post tx.    Follow Up Recommendations  Home health PT;Supervision - Intermittent     Equipment Recommendations  Rolling walker with 5" wheels;3in1 (PT)    Recommendations for Other Services       Precautions / Restrictions Precautions Precautions: Knee Precaution Booklet Issued: Yes (comment) Required Braces or Orthoses: Knee Immobilizer - Right Knee Immobilizer - Right: On when out of bed or walking Restrictions Weight Bearing Restrictions: Yes RLE Weight Bearing: Weight bearing as tolerated    Mobility  Bed Mobility  Transfers   Ambulation/Gait    Stairs   Wheelchair Mobility    Modified Rankin (Stroke Patients Only)       Balance Overall balance assessment: Needs assistance   Sitting balance-Leahy Scale: Good       Standing balance-Leahy Scale: Fair                      Cognition Arousal/Alertness: Awake/alert Behavior During Therapy: WFL for tasks assessed/performed Overall Cognitive Status: Within Functional Limits for tasks assessed                      Exercises Total Joint Exercises Ankle Circles/Pumps: AROM;Both;20 reps;Supine Quad Sets: AROM;Right;10 reps;Supine Towel Squeeze: AROM;Both;10 reps;Supine Short Arc Quad: AAROM;Right;10 reps;Supine Heel Slides: AAROM;Right;10 reps;Supine Hip ABduction/ADduction: AROM;Right;10 reps;Supine Straight Leg Raises: AAROM;Right;10 reps;Supine Long Arc Quad: Right;10 reps;AROM;Seated Knee Flexion: AROM;AAROM;Right;10 reps (1x5 AROM with 10 sec hold and 1x5 AAROM with 10 sec hold.  ) Goniometric ROM:  1-85 degrees R knee AROM in seated position.   Other Exercises Other Exercises: Deferred therapeutic exercise this 1st session.  Will perform this pm.      General Comments        Pertinent Vitals/Pain Pain Assessment: 0-10 Pain Score: 3  Pain Descriptors / Indicators: Aching;Sore;Guarding Pain Intervention(s): Monitored during session;Repositioned    Home Living                      Prior Function            PT Goals (current goals can now be found in the care plan section) Acute Rehab PT Goals Patient Stated Goal: Go home Potential to Achieve Goals: Good Progress towards PT goals: Progressing toward goals    Frequency  7X/week    PT Plan Current plan remains appropriate    Co-evaluation             End of Session Equipment Utilized During Treatment: Gait belt Activity Tolerance: Patient tolerated treatment well Patient left: with call bell/phone within reach;Other (comment);in bed (with lunch)     Time: [id number] PT Time Calculation (min) (ACUTE ONLY): 24 min  Charges: $Therapeutic Exercise: 23-37 mins                     G Codes:      Cristela Blue 24-Oct-2015, 2:52 PM Governor Rooks, PTA pager [phone]

## 2015-10-05 NOTE — Progress Notes (Signed)
Physical Therapy Treatment Patient Details Name: Hannah Jordan MRN: [id number] DOB: 29-Oct-1955 Today's Date: 10/05/2015    History of Present Illness 60 y/o WF s/p R TKA (10/03/15) with PMH of fibromyalgia, UTI, tendonitis, GERD, depression, HTN    PT Comments    Pt performed gait and stair training to prepare for d/c home.  Will f/u this pm to review HEP in preparation for d/c home.    Follow Up Recommendations  Home health PT;Supervision - Intermittent     Equipment Recommendations  Rolling walker with 5" wheels;3in1 (PT)    Recommendations for Other Services       Precautions / Restrictions Precautions Precautions: Knee Precaution Booklet Issued: Yes (comment) Required Braces or Orthoses: Knee Immobilizer - Right Knee Immobilizer - Right: On when out of bed or walking Restrictions Weight Bearing Restrictions: Yes RLE Weight Bearing: Weight bearing as tolerated    Mobility  Bed Mobility Overal bed mobility: Needs Assistance Bed Mobility: Supine to Sit     Supine to sit: Supervision;HOB elevated     General bed mobility comments: Cues for sequencing and RLE advancement to edge of bed.  Pt slow and self assists RLE to edge and off bed.    Transfers Overall transfer level: Needs assistance Equipment used: Rolling walker (2 wheeled) Transfers: Sit to/from Stand Sit to Stand: Supervision         General transfer comment: Cues for safety.  Pt impulsive and stood before gait belt donned or RW in front of patient.  Good balance noted.    Ambulation/Gait Ambulation/Gait assistance: Min guard Ambulation Distance (Feet): 180 Feet (x2 trials.  Required seated break before stair training.  ) Assistive device: Rolling walker (2 wheeled) Gait Pattern/deviations: Step-through pattern;Antalgic;Wide base of support;Decreased stride length   Gait velocity interpretation: Below normal speed for age/gender General Gait Details: Pt required cues for sequencing and upper  trunk control.  Pt performed step through pattern with cues for weight shifting.  Pt fatigues quickly.  Reports discomfort in shoulders.  Adjusted height of RW and comfort improved.     Stairs Stairs: Yes Stairs assistance: Min guard Stair Management: One rail Left;Backwards;Forwards Number of Stairs: 4 General stair comments: Cues for sequencing and hand placement on railing.  Pt limited to 4 stairs due to IV in place and fluid running.  Pt performed forward negotiation to ascend and backward negotiation to descend.    Wheelchair Mobility    Modified Rankin (Stroke Patients Only)       Balance Overall balance assessment: Needs assistance   Sitting balance-Leahy Scale: Good       Standing balance-Leahy Scale: Fair                      Cognition Arousal/Alertness: Awake/alert Behavior During Therapy: WFL for tasks assessed/performed Overall Cognitive Status: Within Functional Limits for tasks assessed                      Exercises Other Exercises Other Exercises: Deferred therapeutic exercise this 1st session.  Will perform this pm.      General Comments        Pertinent Vitals/Pain Pain Assessment: 0-10 Pain Descriptors / Indicators: Aching;Sore;Guarding Pain Intervention(s): Monitored during session;Repositioned    Home Living                      Prior Function            PT Goals (current goals  can now be found in the care plan section) Acute Rehab PT Goals Patient Stated Goal: Go home Potential to Achieve Goals: Good Progress towards PT goals: Progressing toward goals    Frequency  7X/week    PT Plan Current plan remains appropriate    Co-evaluation             End of Session Equipment Utilized During Treatment: Gait belt Activity Tolerance: Patient tolerated treatment well Patient left: with call bell/phone within reach;Other (comment);in bed (with lunch)     Time: [id number] PT Time Calculation (min) (ACUTE  ONLY): 24 min  Charges:  $Gait Training: 8-22 mins $Therapeutic Activity: 8-22 mins                    G Codes:      Cristela Blue 2015/10/26, 2:09 PM  Governor Rooks, PTA pager [phone]

## 2015-10-05 NOTE — Progress Notes (Signed)
Orthopedic Tech Progress Note Patient Details:  Hannah Jordan Mar 10, 1956 [id number]  Patient ID: Hannah Jordan, female   DOB: 1955/05/11, 60 y.o.   MRN: [id number] Applied cpm 0-60  Karolee Stamps 10/05/2015, 7:03 AM

## 2015-10-05 NOTE — Progress Notes (Signed)
Subjective: 2 Days Post-Op Procedure(s) (LRB): RIGHT TOTAL KNEE ARTHROPLASTY (Right) Patient reports pain as 8 on 0-10 scale. Voiding okay. Taking by mouth. Progressing with physical therapy. Positive flatus.   Objective: Vital signs in last 24 hours: Temp:  [97.6 F (36.4 C)-99 F (37.2 C)] 99 F (37.2 C) (06/28 0624) Pulse Rate:  [88-104] 95 (06/28 0624) Resp:  [16-18] 18 (06/28 0624) BP: (143-168)/(68-87) 168/87 mmHg (06/28 0624) SpO2:  [94 %-96 %] 96 % (06/28 0624)  Intake/Output from previous day: 06/27 0701 - 06/28 0700 In: 2031 [P.O.:1040; I.V.:991] Out: 2100 [Urine:2100] Intake/Output this shift: Total I/O In: 240 [P.O.:240] Out: 250 [Urine:250]   Recent Labs  10/04/15 0428 10/05/15 0834  HGB 11.5* 11.2*    Recent Labs  10/04/15 0428 10/05/15 0834  WBC 8.0 10.2  RBC 4.10 4.03  HCT 35.3* 35.1*  PLT 200 204    Recent Labs  10/04/15 0428  NA 137  K 3.8  CL 106  CO2 25  BUN 15  CREATININE 1.06*  GLUCOSE 152*  CALCIUM 8.2*   No results for input(s): LABPT, INR in the last 72 hours. Right knee exam: Neurovascular intact Sensation intact distally Intact pulses distally Dorsiflexion/Plantar flexion intact Incision: dressing C/D/I Compartment soft  Assessment/Plan: 2 Days Post-Op Procedure(s) (LRB): RIGHT TOTAL KNEE ARTHROPLASTY (Right) Plan: Weight-bear as tolerated on right. Aspirin 325 mg twice daily for DVT prophylaxis. 1 month. Up with therapy Discharge home with home health Follow-up with Dr. Berenice Primas in 2 weeks.  [place] G 10/05/2015, 11:02 AM

## 2015-10-05 NOTE — Discharge Summary (Signed)
Patient ID: Hannah Jordan MRN: [id number] DOB/AGE: 08-Sep-1955 60 y.o.  Admit date: 10/03/2015 Discharge date: 10/05/2015  Admission Diagnoses:  Principal Problem:   Primary osteoarthritis of right knee   Discharge Diagnoses:  Same  Past Medical History  Diagnosis Date  . Fibromyalgia   . Tendinitis   . Urinary tract infection   . Gastroesophageal reflux disease   . Helicobacter pylori (H. pylori)      treated  . Asthma   . Hypertension   . Sleep apnea     no cpap use  . Depression   . Anxiety   . Headache     "years ago"  . Restless leg syndrome   . Arthritis   . Diverticulitis   . Complication of anesthesia     severe headache after c-sections    Surgeries: Procedure(s): RIGHT TOTAL KNEE ARTHROPLASTY on 10/03/2015   Discharged Condition: Improved  Hospital Course: Hannah Jordan is an 60 y.o. female who was admitted 10/03/2015 for operative treatment ofPrimary osteoarthritis of right knee. Patient has severe unremitting pain that affects sleep, daily activities, and work/hobbies. After pre-op clearance the patient was taken to the operating room on 10/03/2015 and underwent  Procedure(s): RIGHT TOTAL KNEE ARTHROPLASTY.    Patient was given perioperative antibiotics: Anti-infectives    Start     Dose/Rate Route Frequency Ordered Stop   10/03/15 2200  clindamycin (CLEOCIN) IVPB 600 mg     600 mg 100 mL/hr over 30 Minutes Intravenous Every 6 hours 10/03/15 2100 10/04/15 0521   10/03/15 1330  clindamycin (CLEOCIN) IVPB 900 mg     900 mg 100 mL/hr over 30 Minutes Intravenous On call to O.R. 10/03/15 1301 10/03/15 1641   10/03/15 1304  clindamycin (CLEOCIN) 900 MG/50ML IVPB    Comments:  Debbe Bales, Meredit: cabinet override      10/03/15 1304 10/04/15 0114       Patient was given sequential compression devices, early ambulation, and chemoprophylaxis to prevent DVT.  Patient benefited maximally from hospital stay and there were no complications.    Recent  vital signs: Patient Vitals for the past 24 hrs:  BP Temp Temp src Pulse Resp SpO2  10/05/15 0624 (!) 168/87 mmHg 99 F (37.2 C) Oral 95 18 96 %  10/04/15 2044 (!) 143/69 mmHg 97.6 F (36.4 C) Oral (!) 104 16 94 %  10/04/15 1356 (!) 147/68 mmHg 98.2 F (36.8 C) Oral 88 16 96 %     Recent laboratory studies:  Recent Labs  10/04/15 0428 10/05/15 0834  WBC 8.0 10.2  HGB 11.5* 11.2*  HCT 35.3* 35.1*  PLT 200 204  NA 137  --   K 3.8  --   CL 106  --   CO2 25  --   BUN 15  --   CREATININE 1.06*  --   GLUCOSE 152*  --   CALCIUM 8.2*  --      Discharge Medications:     Medication List    STOP taking these medications        ibuprofen 200 MG tablet  Commonly known as:  ADVIL,MOTRIN      TAKE these medications        albuterol 2 MG tablet  Commonly known as:  PROVENTIL  Take 2 mg by mouth 3 (three) times daily as needed for wheezing or shortness of breath.     aspirin EC 325 MG tablet  Take 1 tablet (325 mg total) by mouth 2 (two) times daily after  a meal. Take x 1 month post op to decrease risk of blood clots.     estradiol 0.025 mg/24hr patch  Commonly known as:  CLIMARA - Dosed in mg/24 hr  Place 0.025 mg onto the skin every Tuesday.     gabapentin 400 MG capsule  Commonly known as:  NEURONTIN  Take 400-800 mg by mouth 4 (four) times daily. Take one capsule three times daily and two capsules at bedtime.     HYDROmorphone 2 MG tablet  Commonly known as:  DILAUDID  Take 1-2 tablets (2-4 mg total) by mouth every 6 (six) hours as needed (For severe breakthrough pain).     losartan 50 MG tablet  Commonly known as:  COZAAR  Take 50 mg by mouth daily as needed (For high blood pressure.).     Melatonin 3 MG Tabs  Take 6-9 mg by mouth at bedtime.     NEXIUM 40 MG capsule  Generic drug:  esomeprazole  Take 40 mg by mouth 2 (two) times daily.     nystatin ointment  Commonly known as:  MYCOSTATIN  Apply 1 application topically 3 (three) times daily as needed  (Apply to affected area.).     oxyCODONE-acetaminophen 5-325 MG tablet  Commonly known as:  PERCOCET/ROXICET  Take 1-2 tablets by mouth every 4 (four) hours as needed for severe pain.     tiZANidine 2 MG tablet  Commonly known as:  ZANAFLEX  Take 1 tablet (2 mg total) by mouth every 8 (eight) hours as needed for muscle spasms.        Diagnostic Studies: Dg Chest 2 View  09/28/2015  CLINICAL DATA:  Preoperative examination. Patient for right knee replacement surgery. EXAM: CHEST  2 VIEW COMPARISON:  PA and lateral chest 01/11/2015. FINDINGS: The lungs are clear. Heart size is normal. No pneumothorax or pleural effusion. No focal bony abnormality. IMPRESSION: Negative chest. Electronically Signed   By: Inge Rise M.D.   On: 09/28/2015 15:53    Disposition: 01-Home or Self Care      Discharge Instructions    CPM    Complete by:  As directed   Continuous passive motion machine (CPM):      Use the CPM from 0 to 60 for 8 hours per day.      You may increase by 5-10 per day.  You may break it up into 2 or 3 sessions per day.      Use CPM for 1-2 weeks or until you are told to stop.     Call MD / Call 911    Complete by:  As directed   If you experience chest pain or shortness of breath, CALL 911 and be transported to the hospital emergency room.  If you develope a fever above 101 F, pus (white drainage) or increased drainage or redness at the wound, or calf pain, call your surgeon's office.     Constipation Prevention    Complete by:  As directed   Drink plenty of fluids.  Prune juice may be helpful.  You may use a stool softener, such as Colace (over the counter) 100 mg twice a day.  Use MiraLax (over the counter) for constipation as needed.     Diet general    Complete by:  As directed      Do not put a pillow under the knee. Place it under the heel.    Complete by:  As directed      Increase activity slowly as  tolerated    Complete by:  As directed      Weight bearing as  tolerated    Complete by:  As directed   Laterality:  right  Extremity:  Lower           Follow-up Information    Follow up with GRAVES,JOHN L, MD. Schedule an appointment as soon as possible for a visit in 2 weeks.   Specialty:  Orthopedic Surgery   Contact information:   [place] [address] [phone]        Signed: Erlene Senters 10/05/2015, 11:07 AM

## 2015-10-05 NOTE — Progress Notes (Signed)
Pt ready to be discharged to home. IV removed. Pt. Is alert and oriented. Pt is hemodynamically stable. AVS reviewed with pt. Capable of re verbalizing medication regimen. Discharge plan appropriate and in place. Awaiting arrival of daughter within the hour.

## 2015-11-30 NOTE — Patient Instructions (Signed)
GWENDLYON BENSTON  11/30/2015     @PREFPERIOPPHARMACY @   Your procedure is scheduled on  12/06/2015   Report to [facility] at  615  A.M.  Call this number if you have problems the morning of surgery:  [phone]   Remember:  Do not eat food or drink liquids after midnight.  Take these medicines the morning of surgery with A SIP OF WATER  Proventil, neurontin, dilaudid or oxycodone, cozzaar, nexium, zanaflex if needed.   Do not wear jewelry, make-up or nail polish.  Do not wear lotions, powders, or perfumes, or deoderant.  Do not shave 48 hours prior to surgery.  Men may shave face and neck.  Do not bring valuables to the hospital.  [facility] is not responsible for any belongings or valuables.  Contacts, dentures or bridgework may not be worn into surgery.  Leave your suitcase in the car.  After surgery it may be brought to your room.  For patients admitted to the hospital, discharge time will be determined by your treatment team.  Patients discharged the day of surgery will not be allowed to drive home.   Name and phone number of your driver:  family Special instructions:  none  Please read over the following fact sheets that you were given. Anesthesia Post-op Instructions and Care and Recovery After Surgery       Incision Care  An incision (cut) is when a surgeon cuts into your body. After surgery, the cut needs to be well cared for to keep it from getting infected.  HOW TO CARE FOR YOUR CUT  Take medicines only as told by your doctor.  There are many different ways to close and cover a cut, including stitches, skin glue, and adhesive strips. Follow your doctor's instructions on:  Care of the cut.  Bandage (dressing) changes and removal.  Cut closure removal.  Do not take baths, swim, or use a hot tub until your doctor says it is okay. You may shower as told by your doctor.  Return to your normal diet and activities as allowed by your doctor.  Use  medicine that helps lessen itching on your cut as told by your doctor. Do not pick or scratch at your cut.  Drink enough fluids to keep your pee (urine) clear or pale yellow. GET HELP IF:  You have redness, puffiness (swelling), or pain at the site of your cut.  You have fluid, blood, or pus coming from your cut.  Your muscles ache.  You have chills or you feel sick.  You have a bad smell coming from the cut or bandage.  Your cut opens up after stitches, staples, or adhesive strips have been removed.  You keep feeling sick to your stomach (nauseous) or keep throwing up (vomiting).  You have a fever.  You are dizzy. GET HELP RIGHT AWAY IF:  You have a rash.  You pass out (faint).  You have trouble breathing. MAKE SURE YOU:   Understand these instructions.  Will watch your condition.  Will get help right away if you are not doing well or get worse.   This information is not intended to replace advice given to you by your health care provider. Make sure you discuss any questions you have with your health care provider.   Document Released: 06/18/2011 Document Revised: 04/16/2014 Document Reviewed: 05/20/2013 Elsevier Interactive Patient Education 2016 [employer] PATIENT INSTRUCTIONS POST-ANESTHESIA  IMMEDIATELY FOLLOWING SURGERY:  Do not drive  or operate machinery for the first twenty four hours after surgery.  Do not make any important decisions for twenty four hours after surgery or while taking narcotic pain medications or sedatives.  If you develop intractable nausea and vomiting or a severe headache please notify your doctor immediately.  FOLLOW-UP:  Please make an appointment with your surgeon as instructed. You do not need to follow up with anesthesia unless specifically instructed to do so.  WOUND CARE INSTRUCTIONS (if applicable):  Keep a dry clean dressing on the anesthesia/puncture wound site if there is drainage.  Once the wound has quit draining you may  leave it open to air.  Generally you should leave the bandage intact for twenty four hours unless there is drainage.  If the epidural site drains for more than 36-48 hours please call the anesthesia department.  QUESTIONS?:  Please feel free to call your physician or the hospital operator if you have any questions, and they will be happy to assist you.

## 2015-12-01 ENCOUNTER — Inpatient Hospital Stay (HOSPITAL_COMMUNITY)
Admission: RE | Admit: 2015-12-01 | Discharge: 2015-12-01 | Disposition: A | Discharge status: Home / Self Care | Payer: Medicaid Other | Source: Ambulatory Visit

## 2015-12-02 ENCOUNTER — Other Ambulatory Visit: Payer: Self-pay | Admitting: Obstetrics and Gynecology

## 2015-12-02 ENCOUNTER — Encounter (HOSPITAL_COMMUNITY)
Admission: RE | Admit: 2015-12-02 | Discharge: 2015-12-02 | Disposition: A | Discharge status: Home / Self Care | Payer: Medicaid Other | Source: Ambulatory Visit | Attending: Surgery | Admitting: Surgery

## 2015-12-02 ENCOUNTER — Encounter (HOSPITAL_COMMUNITY): Payer: Self-pay

## 2015-12-02 ENCOUNTER — Ambulatory Visit (HOSPITAL_COMMUNITY)
Admission: RE | Admit: 2015-12-02 | Discharge: 2015-12-02 | Disposition: A | Discharge status: Home / Self Care | Payer: Medicaid Other | Source: Ambulatory Visit | Attending: Surgery | Admitting: Surgery

## 2015-12-02 DIAGNOSIS — D492 Neoplasm of unspecified behavior of bone, soft tissue, and skin: Secondary | ICD-10-CM | POA: Insufficient documentation

## 2015-12-02 DIAGNOSIS — I517 Cardiomegaly: Secondary | ICD-10-CM | POA: Insufficient documentation

## 2015-12-02 DIAGNOSIS — Z01811 Encounter for preprocedural respiratory examination: Secondary | ICD-10-CM

## 2015-12-02 DIAGNOSIS — Z01810 Encounter for preprocedural cardiovascular examination: Secondary | ICD-10-CM | POA: Insufficient documentation

## 2015-12-02 DIAGNOSIS — Z01812 Encounter for preprocedural laboratory examination: Secondary | ICD-10-CM | POA: Insufficient documentation

## 2015-12-02 LAB — BASIC METABOLIC PANEL
ANION GAP: 9 (ref 5–15)
BUN: 13 mg/dL (ref 6–20)
CALCIUM: 9.2 mg/dL (ref 8.9–10.3)
CO2: 24 mmol/L (ref 22–32)
CREATININE: 0.78 mg/dL (ref 0.44–1.00)
Chloride: 102 mmol/L (ref 101–111)
GFR calc Af Amer: >60 mL/min (ref >60)
GLUCOSE: 90 mg/dL (ref 65–99)
Potassium: 3.9 mmol/L (ref 3.5–5.1)
Sodium: 135 mmol/L (ref 135–145)

## 2015-12-02 LAB — CBC WITH DIFFERENTIAL/PLATELET
BASOS ABS: 0.1 10*3/uL (ref 0.0–0.1)
BASOS PCT: 1 %
EOS ABS: 0.2 10*3/uL (ref 0.0–0.7)
EOS PCT: 3 %
HCT: 39.2 % (ref 36.0–46.0)
Hemoglobin: 13.2 g/dL (ref 12.0–15.0)
Lymphocytes Relative: 37 %
Lymphs Abs: 3.4 10*3/uL (ref 0.7–4.0)
MCH: 29.9 pg (ref 26.0–34.0)
MCHC: 33.7 g/dL (ref 30.0–36.0)
MCV: 88.9 fL (ref 78.0–100.0)
MONO ABS: 0.7 10*3/uL (ref 0.1–1.0)
MONOS PCT: 7 %
NEUTROS ABS: 4.8 10*3/uL (ref 1.7–7.7)
Neutrophils Relative %: 52 %
PLATELETS: 266 10*3/uL (ref 150–400)
RBC: 4.41 MIL/uL (ref 3.87–5.11)
RDW: 13.9 % (ref 11.5–15.5)
WBC: 9.1 10*3/uL (ref 4.0–10.5)

## 2015-12-02 NOTE — Pre-Procedure Instructions (Signed)
Patient given information to sign up for my chart at home. 

## 2015-12-06 ENCOUNTER — Encounter (HOSPITAL_COMMUNITY): Payer: Self-pay | Admitting: *Deleted

## 2015-12-06 ENCOUNTER — Ambulatory Visit (HOSPITAL_COMMUNITY): Payer: Medicaid Other | Admitting: Anesthesiology

## 2015-12-06 ENCOUNTER — Encounter (HOSPITAL_COMMUNITY)
Admission: RE | Disposition: A | Discharge status: Home / Self Care | Payer: Self-pay | Source: Ambulatory Visit | Attending: Surgery

## 2015-12-06 ENCOUNTER — Ambulatory Visit (HOSPITAL_COMMUNITY)
Admission: RE | Admit: 2015-12-06 | Discharge: 2015-12-06 | Disposition: A | Discharge status: Home / Self Care | Payer: Medicaid Other | Source: Ambulatory Visit | Attending: Surgery | Admitting: Surgery

## 2015-12-06 DIAGNOSIS — G4730 Sleep apnea, unspecified: Secondary | ICD-10-CM | POA: Diagnosis not present

## 2015-12-06 DIAGNOSIS — I10 Essential (primary) hypertension: Secondary | ICD-10-CM | POA: Diagnosis not present

## 2015-12-06 DIAGNOSIS — M1990 Unspecified osteoarthritis, unspecified site: Secondary | ICD-10-CM | POA: Diagnosis not present

## 2015-12-06 DIAGNOSIS — Z6839 Body mass index (BMI) 39.0-39.9, adult: Secondary | ICD-10-CM | POA: Diagnosis not present

## 2015-12-06 DIAGNOSIS — K219 Gastro-esophageal reflux disease without esophagitis: Secondary | ICD-10-CM | POA: Insufficient documentation

## 2015-12-06 DIAGNOSIS — C44519 Basal cell carcinoma of skin of other part of trunk: Secondary | ICD-10-CM | POA: Diagnosis not present

## 2015-12-06 DIAGNOSIS — R222 Localized swelling, mass and lump, trunk: Secondary | ICD-10-CM | POA: Diagnosis present

## 2015-12-06 HISTORY — PX: MASS EXCISION: SHX2000

## 2015-12-06 SURGERY — EXCISION MASS
Anesthesia: General | Site: Chest | Laterality: Right

## 2015-12-06 MED ORDER — PROPOFOL 10 MG/ML IV BOLUS
INTRAVENOUS | Status: DC | PRN
  Administered 2015-12-06: 170 mg via INTRAVENOUS

## 2015-12-06 MED ORDER — ONDANSETRON HCL 4 MG/2ML IJ SOLN
4.0000 mg | Freq: Once | INTRAMUSCULAR | Status: AC
  Administered 2015-12-06: 4 mg via INTRAVENOUS

## 2015-12-06 MED ORDER — SODIUM CHLORIDE 0.9 % IR SOLN
Status: DC | PRN
  Administered 2015-12-06: 500 mL

## 2015-12-06 MED ORDER — HYDROMORPHONE HCL 1 MG/ML IJ SOLN: 0.2500 mg | INTRAMUSCULAR | Status: DC | PRN

## 2015-12-06 MED ORDER — LACTATED RINGERS IV SOLN
INTRAVENOUS | Status: DC
  Administered 2015-12-06: 07:00:00 via INTRAVENOUS

## 2015-12-06 MED ORDER — ALBUTEROL SULFATE (2.5 MG/3ML) 0.083% IN NEBU
2.5000 mg | INHALATION_SOLUTION | Freq: Once | RESPIRATORY_TRACT | Status: AC
  Administered 2015-12-06: 2.5 mg via RESPIRATORY_TRACT

## 2015-12-06 MED ORDER — SODIUM CHLORIDE 0.9 % IJ SOLN
INTRAMUSCULAR | Status: AC
  Filled 2015-12-06: qty 10

## 2015-12-06 MED ORDER — SODIUM CHLORIDE 0.9 % IN NEBU
INHALATION_SOLUTION | RESPIRATORY_TRACT | Status: AC
  Filled 2015-12-06: qty 3

## 2015-12-06 MED ORDER — SUCCINYLCHOLINE CHLORIDE 20 MG/ML IJ SOLN
INTRAMUSCULAR | Status: AC
  Filled 2015-12-06: qty 1

## 2015-12-06 MED ORDER — BUPIVACAINE HCL (PF) 0.5 % IJ SOLN
INTRAMUSCULAR | Status: AC
  Filled 2015-12-06: qty 30

## 2015-12-06 MED ORDER — MIDAZOLAM HCL 2 MG/2ML IJ SOLN
1.0000 mg | INTRAMUSCULAR | Status: DC | PRN
  Administered 2015-12-06: 2 mg via INTRAVENOUS

## 2015-12-06 MED ORDER — LIDOCAINE HCL (CARDIAC) 10 MG/ML IV SOLN
INTRAVENOUS | Status: DC | PRN
  Administered 2015-12-06: 40 mg via INTRAVENOUS

## 2015-12-06 MED ORDER — FENTANYL CITRATE (PF) 100 MCG/2ML IJ SOLN
25.0000 ug | INTRAMUSCULAR | Status: AC | PRN
  Administered 2015-12-06 (×2): 25 ug via INTRAVENOUS

## 2015-12-06 MED ORDER — ONDANSETRON HCL 4 MG/2ML IJ SOLN
INTRAMUSCULAR | Status: AC
  Filled 2015-12-06: qty 2

## 2015-12-06 MED ORDER — PROPOFOL 10 MG/ML IV BOLUS
INTRAVENOUS | Status: AC
  Filled 2015-12-06: qty 20

## 2015-12-06 MED ORDER — LIDOCAINE HCL (PF) 1 % IJ SOLN
INTRAMUSCULAR | Status: AC
  Filled 2015-12-06: qty 5

## 2015-12-06 MED ORDER — VANCOMYCIN HCL 10 G IV SOLR
1500.0000 mg | INTRAVENOUS | Status: AC
  Administered 2015-12-06: 1500 mg via INTRAVENOUS
  Filled 2015-12-06: qty 1500

## 2015-12-06 MED ORDER — MIDAZOLAM HCL 2 MG/2ML IJ SOLN
INTRAMUSCULAR | Status: AC
  Filled 2015-12-06: qty 2

## 2015-12-06 MED ORDER — LIDOCAINE HCL 1 % IJ SOLN
INTRAMUSCULAR | Status: DC | PRN
  Administered 2015-12-06: 16 mL via INTRAMUSCULAR

## 2015-12-06 MED ORDER — FENTANYL CITRATE (PF) 100 MCG/2ML IJ SOLN
INTRAMUSCULAR | Status: DC | PRN
  Administered 2015-12-06: 50 ug via INTRAVENOUS

## 2015-12-06 MED ORDER — LIDOCAINE HCL (PF) 1 % IJ SOLN
INTRAMUSCULAR | Status: AC
  Filled 2015-12-06: qty 30

## 2015-12-06 MED ORDER — FENTANYL CITRATE (PF) 100 MCG/2ML IJ SOLN
INTRAMUSCULAR | Status: AC
  Filled 2015-12-06: qty 2

## 2015-12-06 MED ORDER — EPHEDRINE SULFATE 50 MG/ML IJ SOLN
INTRAMUSCULAR | Status: AC
  Filled 2015-12-06: qty 1

## 2015-12-06 MED ORDER — EPHEDRINE SULFATE 50 MG/ML IJ SOLN
INTRAMUSCULAR | Status: DC | PRN
  Administered 2015-12-06: 10 mg via INTRAVENOUS

## 2015-12-06 MED ORDER — ALBUTEROL SULFATE (2.5 MG/3ML) 0.083% IN NEBU
INHALATION_SOLUTION | RESPIRATORY_TRACT | Status: AC
  Filled 2015-12-06: qty 3

## 2015-12-06 MED ORDER — FENTANYL CITRATE (PF) 250 MCG/5ML IJ SOLN
INTRAMUSCULAR | Status: AC
  Filled 2015-12-06: qty 5

## 2015-12-06 SURGICAL SUPPLY — 32 items
ADH SKN CLS APL DERMABOND .7 (GAUZE/BANDAGES/DRESSINGS) ×1
BAG HAMPER (MISCELLANEOUS) ×3 IMPLANT
BLADE SURG 15 STRL LF DISP TIS (BLADE) ×1 IMPLANT
BLADE SURG 15 STRL SS (BLADE) ×3
CLOTH BEACON ORANGE TIMEOUT ST (SAFETY) ×3 IMPLANT
COVER LIGHT HANDLE STERIS (MISCELLANEOUS) ×6 IMPLANT
DECANTER SPIKE VIAL GLASS SM (MISCELLANEOUS) ×4 IMPLANT
DERMABOND ADVANCED (GAUZE/BANDAGES/DRESSINGS) ×2
DERMABOND ADVANCED .7 DNX12 (GAUZE/BANDAGES/DRESSINGS) ×1 IMPLANT
ELECT REM PT RETURN 9FT ADLT (ELECTROSURGICAL) ×3
ELECTRODE REM PT RTRN 9FT ADLT (ELECTROSURGICAL) ×1 IMPLANT
FORMALIN 10 PREFIL 120ML (MISCELLANEOUS) ×3 IMPLANT
GLOVE BIOGEL PI IND STRL 7.0 (GLOVE) ×1 IMPLANT
GLOVE BIOGEL PI IND STRL 7.5 (GLOVE) ×1 IMPLANT
GLOVE BIOGEL PI INDICATOR 7.0 (GLOVE) ×2
GLOVE BIOGEL PI INDICATOR 7.5 (GLOVE) ×2
GLOVE ECLIPSE 7.0 STRL STRAW (GLOVE) ×3 IMPLANT
GOWN STRL REUS W/ TWL LRG LVL3 (GOWN DISPOSABLE) ×2 IMPLANT
GOWN STRL REUS W/TWL LRG LVL3 (GOWN DISPOSABLE) ×12 IMPLANT
KIT ROOM TURNOVER APOR (KITS) ×3 IMPLANT
MANIFOLD NEPTUNE II (INSTRUMENTS) ×3 IMPLANT
NDL HYPO 25X1 1.5 SAFETY (NEEDLE) ×1 IMPLANT
NEEDLE HYPO 25X1 1.5 SAFETY (NEEDLE) ×3 IMPLANT
NS IRRIG 1000ML POUR BTL (IV SOLUTION) ×3 IMPLANT
PACK MINOR (CUSTOM PROCEDURE TRAY) ×3 IMPLANT
PAD ARMBOARD 7.5X6 YLW CONV (MISCELLANEOUS) ×3 IMPLANT
SET BASIN LINEN APH (SET/KITS/TRAYS/PACK) ×3 IMPLANT
SPONGE LAP 18X18 X RAY DECT (DISPOSABLE) ×3 IMPLANT
SUT VIC AB 3-0 SH 27 (SUTURE)
SUT VIC AB 3-0 SH 27X BRD (SUTURE) IMPLANT
SUT VIC AB 4-0 PS2 27 (SUTURE) ×3 IMPLANT
SYR CONTROL 10ML LL (SYRINGE) ×3 IMPLANT

## 2015-12-06 NOTE — Transfer of Care (Signed)
Immediate Anesthesia Transfer of Care Note  Patient: Hannah Jordan  Procedure(s) Performed: Procedure(s): EXCISION 3CM X 2CM MASS RIGHT CHEST WALL (Right)  Patient Location: PACU  Anesthesia Type:General  Level of Consciousness: awake and alert   Airway & Oxygen Therapy: Patient Spontanous Breathing  Post-op Assessment: Report given to RN and Post -op Vital signs reviewed and stable  Post vital signs: Reviewed  Last Vitals:  Vitals:   12/06/15 0705 12/06/15 0835  BP: (!) 153/82 (!) 147/87  Pulse:  82  Resp: 14 12  Temp:  36.7 C    Last Pain:  Vitals:   12/06/15 0635  TempSrc: Oral      Patients Stated Pain Goal: 5 ([phone] [phone])  Complications: No apparent anesthesia complications

## 2015-12-06 NOTE — Anesthesia Procedure Notes (Signed)
Procedure Name: LMA Insertion Date/Time: 12/06/2015 7:44 AM Performed by: Tressie Stalker E Pre-anesthesia Checklist: Patient identified, Patient being monitored, Emergency Drugs available, Timeout performed and Suction available Patient Re-evaluated:Patient Re-evaluated prior to inductionOxygen Delivery Method: Circle System Utilized Preoxygenation: Pre-oxygenation with 100% oxygen Intubation Type: IV induction Ventilation: Mask ventilation without difficulty LMA: LMA inserted LMA Size: 3.0 Number of attempts: 1 Placement Confirmation: positive ETCO2 and breath sounds checked- equal and bilateral

## 2015-12-06 NOTE — Interval H&P Note (Signed)
History and Physical Interval Note:  12/06/2015 7:28 AM  Hannah Jordan  has presented today for surgery, with the diagnosis of 3cm x 2cm mass right chest wall, neoplasm unspecified  The various methods of treatment have been discussed with the patient and family. After consideration of risks, benefits and other options for treatment, the patient has consented to  Procedure(s): EXCISION 3CM X 2CM MASS RIGHT CHEST WALL (Right) as a surgical intervention .  The patient's history has been reviewed, patient examined, no change in status, stable for surgery.  I have reviewed the patient's chart and labs.  Questions were answered to the patient's satisfaction.     Vickie Epley

## 2015-12-06 NOTE — H&P (Signed)
Surgical History and Physical  Subjective: 60 year old female presents fora Right clavicular skin lesion that she reports she first noticed 3 months ago. She didn't seek attention for the mass at that time, because she's "had sores like that before that just went away". This one, however, has continued to grow, and after seeing her PMD for evaluation of the lesion, she is referred to surgery for excisional biopsy of the lesion/mass. Patient denies any associated pain, fever/chills, CP or SOB with exertion or otherwise. She also denies every previously having utilized a tanning bed, but states she's spent much time outdoors in direct sun without having used sun block for UV protection.   Review of Symptoms:  Constitutional:No fevers, chills, or unexplained weight loss Head:Atraumatic; no masses; no abnormalities Eyes:No visual changes or eye pain Nose/Mouth/Throat:No nasal congestion, rhinorrhea, oral lesions, postnasal drip or sore throat  Cardiovascular: No chest pain or palpitations.  Respiratory:No cough, shortness of breath or wheezing  GastrointestinNo diarrhea, constipation, blood in stools, abdominal pain, vomiting or heartburn Musculoskeletal:No arthalgias, myalgias or joint swelling Skin:Right clavicular skin lesion/mass as per HPI   Past Medical History:Reviewed  Past Medical History  Pregnancy Gravida:  2 Pregnancy Para:  2 Surgical History:  Knee surgery, 2 c-sections Medical Problems:  GERD Allergies:   Penicillin Medications:   Nexium 40 mg qD, gabapentin 400 mg TID, estradiol 0.025 ug patch 1x per week   Social History:Reviewed  Social History  Preferred Language: English Race:  White Ethnicity: Not Hispanic / Latino Age: 60 year Marital Status:  D   Smoking Status: Never smoker reviewed on 11/29/2015  Functional Status ------------------------------------------------ Bathing: Normal Cooking: Normal Dressing: Normal Driving: Normal Eating:  Normal Managing Meds: Normal Oral Care: Normal Shopping: Normal Toileting: Normal Transferring: Normal Walking: Normal  Cognitive Status ------------------------------------------------ Attention: Normal Decision Making: Normal Language: Normal Memory: Normal Motor: Normal Perception: Normal Problem Solving: Normal Visual and Spatial: Normal   Family History:Reviewed  Family Health History Mother, Deceased; Hypertension (high blood pressure); Disorder of musculoskeletal system;  Father, Living; Diabetes mellitus, unspecified type; Cancer unspecified;   Vital Signs as of XX123456:  Systolic 123456: Diastolic 123456: Heart Rate 77: Temp 97.65F (Temporal) Height 27ft 2in: Weight 218Lbs 0 Ounces   BMI : 39.87 kg/m2  Physical Exam: General:Well appearing, well nourished, in no distress. Skin:Overall tanned skin with Right clavicular 3 cm (vertical length) x 2 cm width x1.5-2 cm deep fungating beefy pink/red irregular non-tender skin mass/lesion without erythema or drainage  Head:Atraumatic; no masses; no abnormalities Eyes:conjunctiva clear, EOM intact, PERRL Neck:Supple without lymphadenopathy.  Heart:RRR, no murmur Lungs:CTA bilaterally, no wheezes, rhonchi, rales.  Breathing unlabored. Abdomen:Soft, NT/ND, no HSM, no masses. Extremities:No deformities, clubbing, cyanosis, or edema.   Assessment: 60 year old Female with Right clavicular fungating skin neoplasm, concerning for non-pigmented skin malignancy x 3 months.  Diagnosis & Procedure Smart Code   Plan:      - will plan for excisional biopsy of Right clavicular skin neoplasm Monday, 8/28      - discussed with cancer center outpatient follow-up pending surgical pathology results      - advised topical UV skin protection, including clothing and shade      - surgical follow-up 2 weeks after planned surgery  -- Corene Cornea E. Rosana Hoes, MD, [place]: [facility]  Surgery Office #: [phone]

## 2015-12-06 NOTE — Discharge Instructions (Signed)
In addition to included general post-operative instructions for Excision of Skin Mass,  Diet: Resume home heart healthy diet.   Activity: No heavy lifting (children, pets, laundry) or strenuous activity until follow-up, but light activity and walking are encouraged. Do not drive or drink alcohol if taking narcotic pain medications.   Wound care: 2 days after surgery (Thursday, 8/31), may shower/get incision wet with soapy water and pat dry (do not rub incisions), but no baths or submerging incision underwater until follow-up.   Medications: Resume all home medications. For mild to moderate pain: acetaminophen (Tylenol) or ibuprofen (if no kidney disease). Narcotic pain medications, if prescribed, can be used for severe pain, though may cause nausea, constipation, and drowsiness. Do not combine Tylenol and Percocet within a 6 hour period as Percocet contains Tylenol. If you do not need the narcotic pain medication, you do not need to fill the prescription.  Call office [phone]) at any time if any questions, worsening pain, fevers/chills, bleeding, drainage from incision site, or other concerns.

## 2015-12-06 NOTE — Anesthesia Postprocedure Evaluation (Signed)
Anesthesia Post Note  Patient: Hannah Jordan  Procedure(s) Performed: Procedure(s) (LRB): EXCISION 3CM X 2CM MASS RIGHT CHEST WALL (Right)  Patient location during evaluation: Short Stay Anesthesia Type: General Level of consciousness: awake and alert and oriented Pain management: pain level controlled Vital Signs Assessment: post-procedure vital signs reviewed and stable Respiratory status: spontaneous breathing Cardiovascular status: blood pressure returned to baseline Postop Assessment: no signs of nausea or vomiting Anesthetic complications: no    Last Vitals:  Vitals:   12/06/15 0900 12/06/15 0916  BP:  (!) 171/82  Pulse: 65 65  Resp: 11 16  Temp:  36.7 C    Last Pain:  Vitals:   12/06/15 0916  TempSrc: Oral                 Shantil Vallejo

## 2015-12-06 NOTE — Anesthesia Preprocedure Evaluation (Signed)
Anesthesia Evaluation  Patient identified by MRN, date of birth, ID band Patient awake    Reviewed: Allergy & Precautions, H&P , NPO status , Patient's Chart, lab work & pertinent test results  History of Anesthesia Complications (+) POST - OP SPINAL HEADACHE and history of anesthetic complications  Airway Mallampati: II  TM Distance: >3 FB Neck ROM: full    Dental  (+) Teeth Intact   Pulmonary asthma , sleep apnea ,    breath sounds clear to auscultation       Cardiovascular hypertension,  Rhythm:Regular Rate:Normal     Neuro/Psych  Headaches, PSYCHIATRIC DISORDERS Anxiety Depression  Neuromuscular disease    GI/Hepatic GERD  Medicated and Controlled,  Endo/Other  Morbid obesity  Renal/GU      Musculoskeletal  (+) Arthritis , Fibromyalgia -  Abdominal   Peds  Hematology   Anesthesia Other Findings   Reproductive/Obstetrics                             Anesthesia Physical Anesthesia Plan  ASA: II  Anesthesia Plan: General   Post-op Pain Management:    Induction: Intravenous  Airway Management Planned: LMA  Additional Equipment:   Intra-op Plan:   Post-operative Plan: Extubation in OR  Informed Consent: I have reviewed the patients History and Physical, chart, labs and discussed the procedure including the risks, benefits and alternatives for the proposed anesthesia with the patient or authorized representative who has indicated his/her understanding and acceptance.     Plan Discussed with:   Anesthesia Plan Comments:         Anesthesia Quick Evaluation

## 2015-12-06 NOTE — Op Note (Signed)
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 female who presented for a Right infraclavicular chest wall mass x >3 months (though her family states they've been encouraging her to seek attention for it >1 year). All risks, benefits, and alternatives to above procedure were discussed with the patient, all of patient's questions were answered to his expressed satisfaction, and informed consent was obtained and documented.  DETAILS OF PROCEDURE: Patient was brought to the procedure suite and appropriately identified. In supine position, operative site was prepped and draped in the usual sterile fashion, and following a brief time out, a 4 cm wide and 3 cm tall horizontal/transverse elliptical incision was made using a #15 blade scalpel, and incision was extended deep into subcutaneous tissue around the chest wall neoplasm. Electrocautery was utilized to extend the incision underneath the fungating skin neoplasm, which was excised and handed off the field as specimen for pathology analysis. Hemostasis was achieved, and the wound was copiously  irrigated with sterile warm saline. Deep tissue and dermis were re-approximated using buried interrupted 3-0 Vicryl suture, and running subcuticular 4-0 Vicryl suture was used to re-approximate epidermis. Skin was then cleaned and dried, and sterile Dermabond skin glue was applied to the wound. Patient was then safely transferred to PACU/post-procedure care.   I was present for all aspects of the above procedure, and there were no complications apparent.

## 2015-12-08 ENCOUNTER — Encounter (HOSPITAL_COMMUNITY): Payer: Self-pay | Admitting: Surgery

## 2015-12-28 ENCOUNTER — Encounter (HOSPITAL_COMMUNITY): Payer: Medicaid Other | Attending: Hematology & Oncology | Admitting: Hematology & Oncology

## 2015-12-28 ENCOUNTER — Encounter (HOSPITAL_COMMUNITY): Payer: Self-pay | Admitting: Hematology & Oncology

## 2015-12-28 VITALS — BP 192/97 | HR 78 | Temp 97.9°F | Resp 18 | Ht 62.0 in | Wt 219.5 lb

## 2015-12-28 DIAGNOSIS — Z23 Encounter for immunization: Secondary | ICD-10-CM

## 2015-12-28 DIAGNOSIS — C4491 Basal cell carcinoma of skin, unspecified: Secondary | ICD-10-CM

## 2015-12-28 DIAGNOSIS — C44519 Basal cell carcinoma of skin of other part of trunk: Secondary | ICD-10-CM | POA: Diagnosis present

## 2015-12-28 DIAGNOSIS — Z0000 Encounter for general adult medical examination without abnormal findings: Secondary | ICD-10-CM

## 2015-12-28 DIAGNOSIS — W891XXS Exposure to tanning bed, sequela: Secondary | ICD-10-CM

## 2015-12-28 MED ORDER — INFLUENZA VAC SPLIT QUAD 0.5 ML IM SUSY
0.5000 mL | PREFILLED_SYRINGE | Freq: Once | INTRAMUSCULAR | Status: AC
  Administered 2015-12-28: 0.5 mL via INTRAMUSCULAR
  Filled 2015-12-28: qty 0.5

## 2015-12-28 NOTE — Progress Notes (Signed)
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 female is here on referral from Hannah Jordan for an infiltrative basal cell carcinoma of the R chest.   Hannah Jordan is a pleasant 60 y.o. woman who presented to Hannah Jordan' office on 11/29/2015 with a right clavicular skin lesion that she reported she first noticed 3 months prior. Accordingly, she underwent excisional biopsy of the right clavicular skin neoplasm on 12/05/2015 with Hannah Jordan. Pathology revealed infiltrative basal cell carcinoma (2.8 cm) with peripheral margins positive for tumor.  Hannah Jordan is unaccompanied. I personally reviewed and went over pathology results with the patient at length.   She noticed a sore spot in her right chest. This would go away and come back. This spot then came back and would bleed. The physician who performed her right knee replacement advised her to have this area looked at. She was unable to get in with her PCP Hannah Jordan at that time but was referred to Hannah Jordan. Hannah Jordan excised the 3 cm x 2 cm right chest wall mass on 12/06/15.  Admits she was a sunbather and went to tanning beds. She has not been in a tanning bed in about 10 years.   The patient lives in [place]. She is interested in being seen by a dermatology surgeon closer to [facility]. She has been going to see Hannah Jordan since she was 60 years-old and continues to see him in [place]. She is interested in being referred to an oncologist closer to [facility]. She previously lived in [place] when she was married.   She has not had a mammogram yet this year.   The patient is here for further evaluation and discussion of newly diagnosed basal cell carcinoma of the right chest wall.     MEDICAL HISTORY:  Past Medical History:  Diagnosis Date  . Anxiety   . Arthritis   . Asthma   . Complication of anesthesia    severe headache after c-sections  . Depression   . Diverticulitis   . Fibromyalgia   . Gastroesophageal reflux disease   . Headache    "years ago"  . Helicobacter pylori (H. pylori)     treated  . Hypertension   . Restless leg syndrome   . Sleep apnea    no cpap use  . Tendinitis   . Urinary tract infection     SURGICAL HISTORY: Past Surgical History:  Procedure Laterality Date  . CESAREAN SECTION     x 2  . FOOT SURGERY Right   . MASS EXCISION Right 12/06/2015   Procedure: EXCISION 3CM X 2CM MASS RIGHT CHEST WALL;  Surgeon: Vickie Epley, MD;  Location: [facility];  Service: Vascular;  Laterality: Right;  . PARTIAL HYSTERECTOMY    . TOTAL KNEE ARTHROPLASTY Right 10/03/2015   Procedure: RIGHT TOTAL KNEE ARTHROPLASTY;  Surgeon: Dorna Leitz, MD;  Location: [facility];  Service: Orthopedics;  Laterality: Right;    SOCIAL HISTORY: Social History   Social History  . Marital status: Divorced    Spouse name: N/A  . Number of children: N/A  . Years of education: N/A   Occupational History  . Not on file.   Social History Main Topics  . Smoking  status: Never Smoker  . Smokeless tobacco: Never Used  . Alcohol use No  . Drug use: No  . Sexual activity: Yes    Birth control/ protection: Surgical   Other Topics Concern  . Not on file   Social History Narrative  . 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 Hypertension Mother   . Diabetes Father   . Cancer Father    Mother deceased at 63 yo of polymyositis Father deceased at 67 yo of renal cell  caricinoma Sister (1 year younger than patient) recently diagnosed in May 2017 with cervical cancer  ALLERGIES:  is allergic to penicillins and prednisone.  MEDICATIONS:  Current Outpatient Prescriptions  Medication Sig Dispense Refill  . estradiol (CLIMARA - DOSED IN MG/24 HR) 0.025 mg/24hr patch Place 0.025 mg onto the skin every Tuesday.  3  . gabapentin (NEURONTIN) 400 MG capsule Take 400-800 mg by mouth 4 (four) times daily. Take one capsule three times daily and two capsules at bedtime.  4  . losartan (COZAAR) 50 MG tablet Take 50 mg by mouth daily as needed (For high blood pressure.).   11  . Melatonin 3 MG TABS Take 6-9 mg by mouth at bedtime.    Marland Kitchen NEXIUM 40 MG capsule Take 40 mg by mouth 2 (two) times daily.  11   Current Facility-Administered Medications  Medication Dose Route Frequency Provider Last Rate Last Dose  . Influenza vac split quadrivalent PF (FLUARIX) injection 0.5 mL  0.5 mL Intramuscular Once Patrici Ranks, MD        Review of Systems  Constitutional: Negative.   HENT: Negative.   Eyes: Negative.   Respiratory: Negative.   Cardiovascular: Negative.   Gastrointestinal: Negative.   Genitourinary: Negative.   Musculoskeletal: Positive for myalgias.       Fibromyalgia  Skin: Negative.   Neurological: Negative.   Endo/Heme/Allergies: Negative.   Psychiatric/Behavioral: Negative.   All other systems reviewed and are negative. 14 point ROS was done and is otherwise as detailed above or in HPI   PHYSICAL EXAMINATION: ECOG PERFORMANCE STATUS: 0 - Asymptomatic  Vitals:   12/28/15 1440  BP: (!) 192/97  Pulse: 78  Resp: 18  Temp: 97.9 F (36.6 C)   Filed Weights   12/28/15 1440  Weight: 219 lb 8 oz (99.6 kg)    Physical Exam  Constitutional: She is oriented to person, place, and time and well-developed, well-nourished, and in no distress.  HENT:  Head: Normocephalic and atraumatic.  Nose: Nose normal.  Mouth/Throat: Oropharynx is clear and  moist. No oropharyngeal exudate.  Eyes: Conjunctivae and EOM are normal. Pupils are equal, round, and reactive to light. Right eye exhibits no discharge. Left eye exhibits no discharge. No scleral icterus.  Neck: Normal range of motion. Neck supple. No tracheal deviation present. No thyromegaly present.  Cardiovascular: Normal rate, regular rhythm and normal heart sounds.  Exam reveals no gallop and no friction rub.   No murmur heard. Pulmonary/Chest: Effort normal and breath sounds normal. She has no wheezes. She has no rales.  Abdominal: Soft. Bowel sounds are normal. She exhibits no distension and no mass. There is no tenderness. There is no rebound and no guarding.  Musculoskeletal:  Normal range of motion. She exhibits no edema.  Evidence of right knee surgery  Lymphadenopathy:    She has no cervical adenopathy.  Neurological: She is alert and oriented to person, place, and time. She has normal reflexes. No cranial nerve deficit. Gait normal. Coordination normal.  Skin: Skin is warm and dry. No rash noted.  Right chest wall surgical site about 5 cm in length, well healing  Psychiatric: Mood, memory, affect and judgment normal.  Nursing note and vitals reviewed.   LABORATORY DATA:  I have reviewed the data as listed Lab Results  Component Value Date   WBC 9.1 12/02/2015   HGB 13.2 12/02/2015   HCT 39.2 12/02/2015   MCV 88.9 12/02/2015   PLT 266 12/02/2015   CMP     Component Value Date/Time   NA 135 12/02/2015 1325   K 3.9 12/02/2015 1325   CL 102 12/02/2015 1325   CO2 24 12/02/2015 1325   GLUCOSE 90 12/02/2015 1325   BUN 13 12/02/2015 1325   CREATININE 0.78 12/02/2015 1325   CALCIUM 9.2 12/02/2015 1325   PROT 7.4 09/28/2015 1527   ALBUMIN 3.9 09/28/2015 1527   AST 23 09/28/2015 1527   ALT 21 09/28/2015 1527   ALKPHOS 93 09/28/2015 1527   BILITOT 0.3 09/28/2015 1527   GFRNONAA >60 12/02/2015 1325   GFRAA >60 12/02/2015 1325     RADIOGRAPHIC STUDIES: I have  personally reviewed the radiological images as listed and agreed with the findings in the report. Study Result   CLINICAL DATA:  Skin neoplasm.  History of asthma and hypertension.  EXAM: CHEST  2 VIEW  COMPARISON:  None.  FINDINGS: Ill-defined density is noted over the right upper chest. This could be a soft tissue lesion. Mediastinum and hilar structures normal. Cardiomegaly with normal pulmonary vascularity. No pleural effusion or pneumothorax. Degenerative changes thoracic spine. No acute bony abnormality.  IMPRESSION: 1. Ill-defined density is noted over the right upper chest. This could be a soft tissue density. Further evaluation with contrast-enhanced chest CT can be obtained.  2. Mild cardiomegaly.  No CHF.   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 She was given printed information about basal cell carcinoma. Margins were positive after excision.   12/06/15 Pathology reviewed: Infiltrative basal cell carcinoma of the right chest wall with peripheral margins positive for tumor.  I have ordered a CT Neck and Chest in [facility] since she lives in [place]. Will advise her of the results.  If these results are concerning, the patient will be asked to come in for follow up to discuss them.  She will be referred to the [facility] in [place] for re-excision secondary to positive peripheral margins.   If she needs long term follow up with oncology, I will refer the patient to my partner Dr. Marin Olp in [facility].  She is requesting the flu vaccination today.   I will schedule her for prn follow-up with me. Plan is detailed  above. She was encouraged to practice safe "sun habits" ie. Sunscreen, wide brimmed hats and to avoid tanning beds.    MEDICATIONS PRESCRIBED THIS ENCOUNTER: Meds ordered this encounter  Medications  . Influenza vac split quadrivalent PF (FLUARIX) injection 0.5 mL  . DISCONTD: HYDROcodone-acetaminophen (NORCO/VICODIN) 5-325 MG tablet    Sig: TK 1 T PO Q 6 H PRN P    Refill:  0    All questions were answered. The patient knows to call the clinic with any problems, questions or concerns.  This document serves as a record of services personally performed by Ancil Linsey, MD. It was created on her behalf by Arlyce Harman, a trained medical scribe. The creation of this record is based on the scribe's personal observations and the provider's statements to them. This document has been checked and approved by the attending provider.  I have reviewed the above documentation for accuracy and completeness and I agree with the above.  This note was electronically signed.    Molli Hazard, MD  12/28/2015 2:55 PM

## 2015-12-28 NOTE — Progress Notes (Signed)
Hannah Jordan presents today for injection per MD orders. Flu vaccine administered IM in left Upper Arm. Administration without incident. Patient tolerated well.

## 2015-12-28 NOTE — Patient Instructions (Addendum)
[place] at [facility] Discharge Instructions  RECOMMENDATIONS MADE BY THE CONSULTANT AND ANY TEST RESULTS WILL BE SENT TO YOUR REFERRING PHYSICIAN.  You saw Dr. Whitney Muse today. Flu shot today. We are referring you to sin surgery center .  [facility] Dr. Whitney Muse will schedule you an appointment with Dr. Marin Olp.  Thank you for choosing [place] at [facility] to provide your oncology and hematology care.  To afford each patient quality time with our provider, please arrive at least 15 minutes before your scheduled appointment time.   Beginning January 23rd 2017 lab work for the [facility] will be done in the  Main lab at [facility] on 1st floor. If you have a lab appointment with the [facility] please come in thru the  Main Entrance and check in at the main information desk  You need to re-schedule your appointment should you arrive 10 or more minutes late.  We strive to give you quality time with our providers, and arriving late affects you and other patients whose appointments are after yours.  Also, if you no show three or more times for appointments you may be dismissed from the clinic at the providers discretion.     Again, thank you for choosing [facility].  Our hope is that these requests will decrease the amount of time that you wait before being seen by our physicians.       _____________________________________________________________  Should you have questions after your visit to [facility], please contact our office at [phone] between the hours of 8:30 a.m. and 4:30 p.m.  Voicemails left after 4:30 p.m. will not be returned until the following business day.  For prescription refill requests, have your pharmacy contact our office.         Resources For Cancer Patients and their Caregivers ? American Cancer Society: Can assist with transportation, wigs,  general needs, runs Look Good Feel Better.        3200455776 ? Cancer Care: Provides financial assistance, online support groups, medication/co-pay assistance.  1-800-813-HOPE 630-845-9716) ? [facility] Assists [place] cancer patients and their families through emotional , educational and financial support.  4170304270 ? [place] DSS Where to apply for food stamps, Medicaid and utility assistance. 385-215-4813 ? [facility]: Transportation to medical appointments. 409-135-6706 ? Social Security Administration: May apply for disability if have a Stage IV cancer. 360-801-9653 931-512-5353 ? [employer], Disability and Transit Services: Assists with nutrition, care and transit needs. [place] Support Programs: @10RELATIVEDAYS @ > Cancer Support Group  2nd Tuesday of the month 1pm-2pm, Journey Room  > Creative Journey  3rd Tuesday of the month 1130am-1pm, Journey Room  > Look Good Feel Better  1st Wednesday of the month 10am-12 noon, Journey Room (Call [facility] to register [phone])

## 2015-12-29 ENCOUNTER — Telehealth (HOSPITAL_COMMUNITY): Payer: Self-pay | Admitting: Hematology & Oncology

## 2015-12-29 NOTE — Telephone Encounter (Signed)
FAXED NEW PT REF TO SKIN DR

## 2016-01-03 ENCOUNTER — Ambulatory Visit (HOSPITAL_BASED_OUTPATIENT_CLINIC_OR_DEPARTMENT_OTHER)
Admission: RE | Admit: 2016-01-03 | Discharge: 2016-01-03 | Disposition: A | Discharge status: Home / Self Care | Payer: Medicaid Other | Source: Ambulatory Visit | Attending: Internal Medicine | Admitting: Internal Medicine

## 2016-01-03 ENCOUNTER — Telehealth (HOSPITAL_COMMUNITY): Payer: Self-pay | Admitting: Oncology

## 2016-01-03 ENCOUNTER — Ambulatory Visit (HOSPITAL_BASED_OUTPATIENT_CLINIC_OR_DEPARTMENT_OTHER)
Admission: RE | Admit: 2016-01-03 | Discharge: 2016-01-03 | Disposition: A | Discharge status: Home / Self Care | Payer: Medicaid Other | Source: Ambulatory Visit | Attending: Hematology & Oncology | Admitting: Hematology & Oncology

## 2016-01-03 ENCOUNTER — Encounter: Payer: Self-pay | Admitting: *Deleted

## 2016-01-03 ENCOUNTER — Other Ambulatory Visit (HOSPITAL_BASED_OUTPATIENT_CLINIC_OR_DEPARTMENT_OTHER): Payer: Self-pay | Admitting: Internal Medicine

## 2016-01-03 DIAGNOSIS — K760 Fatty (change of) liver, not elsewhere classified: Secondary | ICD-10-CM | POA: Diagnosis not present

## 2016-01-03 DIAGNOSIS — R911 Solitary pulmonary nodule: Secondary | ICD-10-CM | POA: Insufficient documentation

## 2016-01-03 DIAGNOSIS — C4491 Basal cell carcinoma of skin, unspecified: Secondary | ICD-10-CM | POA: Insufficient documentation

## 2016-01-03 DIAGNOSIS — Z1231 Encounter for screening mammogram for malignant neoplasm of breast: Secondary | ICD-10-CM | POA: Diagnosis not present

## 2016-01-03 DIAGNOSIS — K449 Diaphragmatic hernia without obstruction or gangrene: Secondary | ICD-10-CM | POA: Diagnosis not present

## 2016-01-03 DIAGNOSIS — R928 Other abnormal and inconclusive findings on diagnostic imaging of breast: Secondary | ICD-10-CM | POA: Insufficient documentation

## 2016-01-03 MED ORDER — IOPAMIDOL (ISOVUE-300) INJECTION 61%
100.0000 mL | Freq: Once | INTRAVENOUS | Status: AC | PRN
  Administered 2016-01-03: 100 mL via INTRAVENOUS

## 2016-01-03 NOTE — Progress Notes (Signed)
[place] Psychosocial Distress Screening Clinical Social Work  Clinical Social Work was referred by distress screening protocol.  The patient scored a 10 on the Psychosocial Distress Thermometer which indicates severe distress. Clinical Social Worker attempted to phone pt to assess for distress and other psychosocial needs. CSW left detailed message explaining role of CSW and how CSW could assist. Pt appears to be possibly transferring to [facility] and this CSW covers there via phone. CSW awaits return call.   ONCBCN DISTRESS SCREENING 12/28/2015  Screening Type Initial Screening  Distress experienced in past week (1-10) 10  Practical problem type Work/school;Transportation;Childcare  Family Problem type Children  Emotional problem type Depression;Nervousness/Anxiety;Adjusting to illness  Spiritual/Religous concerns type Relating to God  Information Concerns Type Lack of info about diagnosis  Physical Problem type Pain;Sleep/insomnia;Constipation/diarrhea;Tingling hands/feet;Other (comment)  Physician notified of physical symptoms Yes  Referral to clinical psychology No  Referral to clinical social work Yes  Referral to dietition No  Referral to financial advocate No  Referral to support programs No  Referral to palliative care No    Clinical Social Worker follow up needed: Yes.    If yes, follow up plan: CSW awaits return call.  Loren Racer, [place] Tuesdays   [phone]

## 2016-01-03 NOTE — Telephone Encounter (Signed)
Peer to peer conversation completed for approval CT of chest with contrast. This was approved. Approval number is [id number].  Hannah Jordan 01/03/2016 8:23 AM

## 2016-01-05 ENCOUNTER — Other Ambulatory Visit: Payer: Self-pay | Admitting: Internal Medicine

## 2016-01-05 DIAGNOSIS — R928 Other abnormal and inconclusive findings on diagnostic imaging of breast: Secondary | ICD-10-CM

## 2016-01-06 ENCOUNTER — Encounter (HOSPITAL_COMMUNITY): Payer: Self-pay | Admitting: Hematology & Oncology

## 2016-01-10 ENCOUNTER — Inpatient Hospital Stay: Admission: RE | Admit: 2016-01-10 | Payer: Medicaid Other | Source: Ambulatory Visit

## 2016-01-13 ENCOUNTER — Ambulatory Visit
Admission: RE | Admit: 2016-01-13 | Discharge: 2016-01-13 | Disposition: A | Discharge status: Home / Self Care | Payer: Medicaid Other | Source: Ambulatory Visit | Attending: Internal Medicine | Admitting: Internal Medicine

## 2016-01-13 DIAGNOSIS — R928 Other abnormal and inconclusive findings on diagnostic imaging of breast: Secondary | ICD-10-CM

## 2016-02-15 ENCOUNTER — Other Ambulatory Visit (HOSPITAL_COMMUNITY): Payer: Self-pay | Admitting: Internal Medicine

## 2016-02-15 DIAGNOSIS — N6489 Other specified disorders of breast: Secondary | ICD-10-CM

## 2016-02-15 DIAGNOSIS — Z09 Encounter for follow-up examination after completed treatment for conditions other than malignant neoplasm: Secondary | ICD-10-CM

## 2016-07-11 ENCOUNTER — Ambulatory Visit (HOSPITAL_BASED_OUTPATIENT_CLINIC_OR_DEPARTMENT_OTHER): Payer: Medicaid Other | Admitting: Hematology & Oncology

## 2016-07-11 ENCOUNTER — Ambulatory Visit: Payer: Medicaid Other

## 2016-07-11 ENCOUNTER — Other Ambulatory Visit (HOSPITAL_BASED_OUTPATIENT_CLINIC_OR_DEPARTMENT_OTHER): Payer: Medicaid Other

## 2016-07-11 VITALS — BP 169/88 | HR 70 | Temp 98.1°F | Resp 16 | Wt 226.8 lb

## 2016-07-11 DIAGNOSIS — R911 Solitary pulmonary nodule: Secondary | ICD-10-CM | POA: Diagnosis not present

## 2016-07-11 DIAGNOSIS — Z85828 Personal history of other malignant neoplasm of skin: Secondary | ICD-10-CM

## 2016-07-11 DIAGNOSIS — C44519 Basal cell carcinoma of skin of other part of trunk: Secondary | ICD-10-CM

## 2016-07-11 LAB — COMPREHENSIVE METABOLIC PANEL (CC13)
A/G RATIO: 1.3 (ref 1.2–2.2)
ALBUMIN: 4.1 g/dL (ref 3.6–4.8)
ALK PHOS: 118 IU/L — AB (ref 39–117)
ALT: 17 IU/L (ref 0–32)
AST: 13 IU/L (ref 0–40)
BUN / CREAT RATIO: 20 (ref 12–28)
BUN: 17 mg/dL (ref 8–27)
Bilirubin Total: 0.2 mg/dL (ref 0.0–1.2)
CALCIUM: 9.5 mg/dL (ref 8.7–10.3)
CREATININE: 0.86 mg/dL (ref 0.57–1.00)
Carbon Dioxide, Total: 27 mmol/L (ref 18–29)
Chloride, Ser: 103 mmol/L (ref 96–106)
GFR calc Af Amer: 84 mL/min/{1.73_m2} (ref >59)
GFR, EST NON AFRICAN AMERICAN: 73 mL/min/{1.73_m2} (ref >59)
GLOBULIN, TOTAL: 3.1 g/dL (ref 1.5–4.5)
Glucose: 85 mg/dL (ref 65–99)
POTASSIUM: 4.2 mmol/L (ref 3.5–5.2)
SODIUM: 138 mmol/L (ref 134–144)
Total Protein: 7.2 g/dL (ref 6.0–8.5)

## 2016-07-11 LAB — CBC WITH DIFFERENTIAL (CANCER CENTER ONLY)
BASO#: 0 10*3/uL (ref 0.0–0.2)
BASO%: 0.5 % (ref 0.0–2.0)
EOS%: 3.3 % (ref 0.0–7.0)
Eosinophils Absolute: 0.3 10*3/uL (ref 0.0–0.5)
HCT: 40.1 % (ref 34.8–46.6)
HGB: 13.7 g/dL (ref 11.6–15.9)
LYMPH#: 2.5 10*3/uL (ref 0.9–3.3)
LYMPH%: 32.9 % (ref 14.0–48.0)
MCH: 29.1 pg (ref 26.0–34.0)
MCHC: 34.2 g/dL (ref 32.0–36.0)
MCV: 85 fL (ref 81–101)
MONO#: 0.6 10*3/uL (ref 0.1–0.9)
MONO%: 7.4 % (ref 0.0–13.0)
NEUT%: 55.9 % (ref 39.6–80.0)
NEUTROS ABS: 4.3 10*3/uL (ref 1.5–6.5)
Platelets: 232 10*3/uL (ref 145–400)
RBC: 4.7 10*6/uL (ref 3.70–5.32)
RDW: 13.8 % (ref 11.1–15.7)
WBC: 7.7 10*3/uL (ref 3.9–10.0)

## 2016-07-11 LAB — TECHNOLOGIST REVIEW CHCC SATELLITE

## 2016-07-11 NOTE — Progress Notes (Signed)
Referral MD  Reason for Referral: Basal cell carcinoma of the right chest wall-previously resected   Chief Complaint  Patient presents with  . Follow-up  : I had skin cancer on my chest.  HPI: Hannah Jordan is a very charming 61 year old white female. She lives in [place] but has had her doctors up in [place].   She has a history of basal cell carcinoma of the upper right chest wall. This was noted back in August 2017. She was seen by surgery. She underwent incision. This was a 2.8 cm lesion. It was found to be basal cell carcinoma. The pathology report is [phone]. The margins were positive.  She was referred to the [facility] for reexcision. I do not have any pathology results from the [facility].  She did have a CT scan of the chest done. This is done on 01/03/2016. This showed a nonspecific 4 mm subpleural nodule in the left lower lobe.  She was referred to the [facility] for continued follow-up.  Her only complaint is that she has a lesion on her upper lip. She says she had a rash all over her face recently. I'm not sure what would've caused this. This seems to have disappeared.  She does have other health issues. She has fibromyalgia.  She is up-to-date with her mammograms. Her last mammogram was in October 2017. It was recommended that she have a mammogram in 6 months area and there was some asymmetry on the right breast. She hopefully will get a follow-up mammogram this month.  She does not smoke. She does not drink.  She has had no fever. She has had no infections. She does have a lot of sun damage on her skin. She does not see a dermatologist. We will have to refer her to a dermatologist. She wants to see one in [facility].  Overall, her performance status is ECOG 0.    Past Medical History:  Diagnosis Date  . Anxiety   . Arthritis   . Asthma   . Complication of anesthesia    severe headache after c-sections  . Depression    . Diverticulitis   . Fibromyalgia   . Gastroesophageal reflux disease   . Headache    "years ago"  . Helicobacter pylori (H. pylori)     treated  . Hypertension   . Restless leg syndrome   . Sleep apnea    no cpap use  . Tendinitis   . Urinary tract infection   :  Past Surgical History:  Procedure Laterality Date  . CESAREAN SECTION     x 2  . FOOT SURGERY Right   . MASS EXCISION Right 12/06/2015   Procedure: EXCISION 3CM X 2CM MASS RIGHT CHEST WALL;  Surgeon: Vickie Epley, MD;  Location: [facility];  Service: Vascular;  Laterality: Right;  . PARTIAL HYSTERECTOMY    . TOTAL KNEE ARTHROPLASTY Right 10/03/2015   Procedure: RIGHT TOTAL KNEE ARTHROPLASTY;  Surgeon: Dorna Leitz, MD;  Location: [facility];  Service: Orthopedics;  Laterality: Right;  :   Current Outpatient Prescriptions:  .  estradiol (CLIMARA - DOSED IN MG/24 HR) 0.025 mg/24hr patch, Place 0.025 mg onto the skin every Tuesday., Disp: , Rfl: 3 .  gabapentin (NEURONTIN) 400 MG capsule, Take 400-800 mg by mouth 4 (four) times daily. Take one capsule three times daily and two capsules at bedtime., Disp: , Rfl: 4 .  losartan (COZAAR) 50 MG tablet, Take 50 mg by  mouth daily as needed (For high blood pressure.). , Disp: , Rfl: 11 .  Melatonin 3 MG TABS, Take 6-9 mg by mouth at bedtime., Disp: , Rfl:  .  NEXIUM 40 MG capsule, Take 40 mg by mouth 2 (two) times daily., Disp: , Rfl: 11 .  escitalopram (LEXAPRO) 10 MG tablet, Take 10 mg by mouth daily., Disp: , Rfl: 10:  :  Allergies  Allergen Reactions  . Penicillins Itching, Swelling and Other (See Comments)    Has patient had a PCN reaction causing immediate rash, facial/tongue/throat swelling, SOB or lightheadedness with hypotension: YES Has patient had a PCN reaction causing severe rash involving mucus membranes or skin necrosis: NO Has patient had a PCN reaction that required hospitalization  YES Has patient had a PCN reaction occurring within the last 10 years: YES   .  Prednisone Other (See Comments)    NO REACTION PT REFUSES  :  Family History  Problem Relation Age of Onset  . Hypertension Mother   . Diabetes Father   . Cancer Father   :  Social History   Social History  . Marital status: Divorced    Spouse name: N/A  . Number of children: N/A  . Years of education: N/A   Occupational History  . Not on file.   Social History Main Topics  . Smoking status: Never Smoker  . Smokeless tobacco: Never Used  . Alcohol use No  . Drug use: No  . Sexual activity: Yes    Birth control/ protection: Surgical   Other Topics Concern  . Not on file   Social History Narrative  . No narrative on file  :  Pertinent items are noted in HPI.  Exam:Obese white female in no obvious distress. Her vital signs show a temperature of 98.1. Pulse 70. Blood pressure 169/88. Weight is 227 pounds. Head and neck exam shows no ocular or oral lesions. She has no palpable cervical or supraclavicular lymph nodes. She does have a small-3-4 mm scaly lesion on the upper lip just to the right of the frenulum. Thyroid is nonpalpable. Lungs are clear bilaterally. Cardiac exam regular rate and rhythm with no murmurs, rubs or bruits. Abdomen is obese but soft. She has good bowel sounds. There is no fluid wave. There is no palpable liver or spleen tip. Back exam shows no tenderness over the spine, ribs or hips. Extremities shows no clubbing, cyanosis or edema. She has good range of motion of her joints. Skin exam does show some skin damage. She has some solar keratoses on her skin. I don't see any suspicious hyperpigmented lesions. Neurological exam shows no focal neurological deficits.     Recent Labs  07/11/16 1515  WBC 7.7  HGB 13.7  HCT 40.1  PLT 232   No results for input(s): NA, K, CL, CO2, GLUCOSE, BUN, CREATININE, CALCIUM in the last 72 hours.  Blood smear review:  None  Pathology: See above     Assessment and Plan:  Hannah Jordan is a very charming 61 year old  white female. She has a history of an infiltrative basal cell carcinoma of the right upper anterior chest wall. This was resected. The margins were positive. She apparently underwent reexcision.  I would not think that this would cause her a problem. However, it would not be about I did to follow-up with a CT scan. She had this nondescript 4 mm left lower lobe nodule that needs to be followed. I will get this set up in 4  months.  She does need to see a dermatologist. We will see about making her a referral to [facility].  I really don't think there is much that I need to do. I don't see that she has metastatic disease.  She is very nice. I want to make sure that we are not to "cavalier" with this history of the basal cell carcinoma.  I gave her a prayer blanket. She is very thankful for that.  I will see her back in 4 months. We will get a CT scan the day that we see her back.  I answered all of her questions.

## 2016-07-12 LAB — LACTATE DEHYDROGENASE: LDH: 211 U/L (ref 125–245)

## 2016-07-13 ENCOUNTER — Telehealth: Payer: Self-pay | Admitting: *Deleted

## 2016-07-13 NOTE — Telephone Encounter (Signed)
[facility] calling. Unable to schedule patient, d/t she has medicaid.

## 2016-10-12 ENCOUNTER — Emergency Department (HOSPITAL_BASED_OUTPATIENT_CLINIC_OR_DEPARTMENT_OTHER)
Admission: EM | Admit: 2016-10-12 | Discharge: 2016-10-12 | Disposition: A | Discharge status: Home / Self Care | Payer: Medicaid Other | Attending: Emergency Medicine | Admitting: Emergency Medicine

## 2016-10-12 ENCOUNTER — Emergency Department (HOSPITAL_BASED_OUTPATIENT_CLINIC_OR_DEPARTMENT_OTHER): Payer: Medicaid Other

## 2016-10-12 ENCOUNTER — Encounter (HOSPITAL_BASED_OUTPATIENT_CLINIC_OR_DEPARTMENT_OTHER): Payer: Self-pay | Admitting: Emergency Medicine

## 2016-10-12 DIAGNOSIS — M1990 Unspecified osteoarthritis, unspecified site: Secondary | ICD-10-CM

## 2016-10-12 DIAGNOSIS — I10 Essential (primary) hypertension: Secondary | ICD-10-CM | POA: Diagnosis not present

## 2016-10-12 DIAGNOSIS — Z96651 Presence of right artificial knee joint: Secondary | ICD-10-CM | POA: Diagnosis not present

## 2016-10-12 DIAGNOSIS — J45909 Unspecified asthma, uncomplicated: Secondary | ICD-10-CM | POA: Insufficient documentation

## 2016-10-12 DIAGNOSIS — M25562 Pain in left knee: Secondary | ICD-10-CM | POA: Diagnosis present

## 2016-10-12 DIAGNOSIS — M1712 Unilateral primary osteoarthritis, left knee: Secondary | ICD-10-CM | POA: Insufficient documentation

## 2016-10-12 DIAGNOSIS — Z79899 Other long term (current) drug therapy: Secondary | ICD-10-CM | POA: Insufficient documentation

## 2016-10-12 MED ORDER — ACETAMINOPHEN 500 MG PO TABS
1000.0000 mg | ORAL_TABLET | Freq: Once | ORAL | Status: AC
  Administered 2016-10-12: 1000 mg via ORAL
  Filled 2016-10-12: qty 2

## 2016-10-12 MED ORDER — MELOXICAM 7.5 MG PO TABS: 15.0000 mg | ORAL_TABLET | Freq: Every day | ORAL | 0 refills | Status: AC

## 2016-10-12 MED ORDER — DICLOFENAC SODIUM 75 MG PO TBEC: 75.0000 mg | DELAYED_RELEASE_TABLET | Freq: Two times a day (BID) | ORAL | 0 refills | Status: DC

## 2016-10-12 MED ORDER — FENTANYL CITRATE (PF) 100 MCG/2ML IJ SOLN
50.0000 ug | Freq: Once | INTRAMUSCULAR | Status: DC
  Filled 2016-10-12: qty 1

## 2016-10-12 MED ORDER — KETOROLAC TROMETHAMINE 30 MG/ML IJ SOLN
30.0000 mg | Freq: Once | INTRAMUSCULAR | Status: AC
  Administered 2016-10-12: 30 mg via INTRAMUSCULAR
  Filled 2016-10-12: qty 1

## 2016-10-12 MED FILL — MELOXICAM 15 MG TABLET: 15 | 15 days supply | Qty: 15 | Fill #0

## 2016-10-12 NOTE — ED Notes (Signed)
Pt wanted to speak with provider Suezanne Jacquet before applying any ortho device, PA Suezanne Jacquet was informed.

## 2016-10-12 NOTE — ED Triage Notes (Addendum)
Patients reports left knee pain without injury x 1.5 weeks.   Reports difficulty with ambulation.  Reports seen by PCP on 6/26 and given RX for tramadol.  Reports no relief with this.

## 2016-10-12 NOTE — ED Provider Notes (Addendum)
[facility] DEPT [facility] Provider Note   CSN: [id number] Arrival date & time: 10/12/16  1222     History   Chief Complaint Chief Complaint  Patient presents with  . Knee Pain    HPI Hannah Jordan is a 61 y.o. female.  HPI here for evaluation of gradually worsening left-sided knee pain. She reports sharp pain in the front of her knee that is worse with walking and certain movements. She reports swelling in her knee. She denies any injury. No fevers, chills, numbness or weakness. No chest pain, shortness of breath, cough or unilateral leg swelling. She was seen by her PCP for this problem on 6/26, prescribed tramadol, but this was not helpful. Nothing else tried to improve symptoms. Reports she has orthopedist she can follow-up with.  Past Medical History:  Diagnosis Date  . Anxiety   . Arthritis   . Asthma   . Complication of anesthesia    severe headache after c-sections  . Depression   . Diverticulitis   . Fibromyalgia   . Gastroesophageal reflux disease   . Headache    "years ago"  . Helicobacter pylori (H. pylori)     treated  . Hypertension   . Restless leg syndrome   . Sleep apnea    no cpap use  . Tendinitis   . Urinary tract infection     Patient Active Problem List   Diagnosis Date Noted  . Basal cell carcinoma of anterior chest 07/11/2016  . Primary osteoarthritis of right knee 10/03/2015    Past Surgical History:  Procedure Laterality Date  . CESAREAN SECTION     x 2  . FOOT SURGERY Right   . MASS EXCISION Right 12/06/2015   Procedure: EXCISION 3CM X 2CM MASS RIGHT CHEST WALL;  Surgeon: Vickie Epley, MD;  Location: [facility];  Service: Vascular;  Laterality: Right;  . PARTIAL HYSTERECTOMY    . TOTAL KNEE ARTHROPLASTY Right 10/03/2015   Procedure: RIGHT TOTAL KNEE ARTHROPLASTY;  Surgeon: Dorna Leitz, MD;  Location: [facility];  Service: Orthopedics;  Laterality: Right;    OB History    No data available       Home Medications    Prior to  Admission medications   Medication Sig Start Date End Date Taking? Authorizing Provider  diclofenac (VOLTAREN) 75 MG EC tablet Take 1 tablet (75 mg total) by mouth 2 (two) times daily. 10/12/16   Maikel Neisler, Marland Kitchen, PA-C  escitalopram (LEXAPRO) 10 MG tablet Take 10 mg by mouth daily. 06/22/16   [provider]  estradiol (CLIMARA - DOSED IN MG/24 HR) 0.025 mg/24hr patch Place 0.025 mg onto the skin every Tuesday. 09/06/15   [provider]  gabapentin (NEURONTIN) 400 MG capsule Take 400-800 mg by mouth 4 (four) times daily. Take one capsule three times daily and two capsules at bedtime. 09/06/15   [provider]  losartan (COZAAR) 50 MG tablet Take 50 mg by mouth daily as needed (For high blood pressure.).  06/19/15   [provider]  Melatonin 3 MG TABS Take 6-9 mg by mouth at bedtime.    [provider]  NEXIUM 40 MG capsule Take 40 mg by mouth 2 (two) times daily. 08/24/15   [provider]    Family History Family History  Problem Relation Age of Onset  . Hypertension Mother   . Diabetes Father   . Cancer Father     Social History Social History  Substance Use Topics  . Smoking status:  Never Smoker  . Smokeless tobacco: Never Used  . Alcohol use No     Allergies   Penicillins and Prednisone   Review of Systems Review of Systems See history of present illness  Physical Exam Updated Vital Signs BP (!) 150/89 (BP Location: Left Arm)   Pulse 72   Temp 98.5 F (36.9 C) (Oral)   Resp 18   Ht 5\' 2"  (1.575 m)   Wt 99.8 kg (220 lb)   SpO2 97%   BMI 40.24 kg/m   Physical Exam  Constitutional: She appears well-developed. No distress.  Awake, alert and nontoxic in appearance  HENT:  Head: Normocephalic and atraumatic.  Right Ear: External ear normal.  Left Ear: External ear normal.  Mouth/Throat: Oropharynx is clear and moist.  Eyes: Conjunctivae and EOM are normal. Pupils are equal, round, and reactive to light.  Neck:  Normal range of motion. No JVD present.  Cardiovascular: Normal rate, regular rhythm and normal heart sounds.   Pulmonary/Chest: Effort normal and breath sounds normal. No stridor.  Abdominal: Soft. There is no tenderness.  Musculoskeletal: Normal range of motion.  Left knee: Small joint effusion by ballottement, tenderness along the medial femoral tibial joint line. Some tenderness over the patellar tendon. No focal bony pain. No unilateral leg swelling. No calf pain and negative Homans. No ligamentous laxity. Distal pulses intact with brisk cap refill.  Neurological:  Awake, alert, cooperative and aware of situation; motor strength bilaterally; sensation normal to light touch bilaterally; no facial asymmetry; tongue midline; major cranial nerves appear intact;    Skin: No rash noted. She is not diaphoretic.  Psychiatric: She has a normal mood and affect. Her behavior is normal. Thought content normal.  Nursing note and vitals reviewed.    ED Treatments / Results  Labs (all labs ordered are listed, but only abnormal results are displayed) Labs Reviewed - No data to display  EKG  EKG Interpretation None      Vitals:   10/12/16 1231 10/12/16 1232  BP: (!) 150/89   Pulse: 72   Resp: 18   Temp: 98.5 F (36.9 C)   TempSrc: Oral   SpO2: 97%   Weight:  99.8 kg (220 lb)  Height:  5\' 2"  (1.575 m)    Radiology Dg Knee Complete 4 Views Left  Result Date: 10/12/2016 CLINICAL DATA:  Left knee pain. EXAM: LEFT KNEE - COMPLETE 4+ VIEW COMPARISON:  07/20/2008 . FINDINGS: Tricompartment degenerative change present. No acute abnormality identified. No evidence of fracture.Small corticated bony density noted over the soft tissues of the medial knee, possibly representing a small venous calcification . IMPRESSION: Tricompartment degenerative change.  No acute abnormality. Electronically Signed   By: Marcello Moores  Register   On: 10/12/2016 13:31    Procedures Procedures (including critical care  time)  Medications Ordered in ED Medications  fentaNYL (SUBLIMAZE) injection 50 mcg (not administered)  ketorolac (TORADOL) 30 MG/ML injection 30 mg (30 mg Intramuscular Given 10/12/16 1346)  acetaminophen (TYLENOL) tablet 1,000 mg (1,000 mg Oral Given 10/12/16 1346)     Initial Impression / Assessment and Plan / ED Course  I have reviewed the triage vital signs and the nursing notes.  Pertinent labs & imaging results that were available during my care of the patient were reviewed by me and considered in my medical decision making (see chart for details).   gradually worsening knee pain, atraumatic for the past 2 weeks. Exam not concerning for overt infection, vascular compromise or other emergent pathology. X-rays  consistent with osteoarthritis. Patient given Toradol, Tylenol and fentanyl and emergency department with appropriate pain relief. Given a knee immobilizer for comfort as well as prescription for Meloxicam. We'll be able to follow-up with her orthopedist. Return precautions discussed.  Final Clinical Impressions(s) / ED Diagnoses   Final diagnoses:  Acute pain of left knee  Arthritis    New Prescriptions New Prescriptions   DICLOFENAC (VOLTAREN) 75 MG EC TABLET    Take 1 tablet (75 mg total) by mouth 2 (two) times daily.     Comer Locket, PA-C 10/12/16 [place], [place], DO 10/12/16 [place], PA-C 10/12/16 [place], DO 10/12/16 1529

## 2016-11-15 ENCOUNTER — Other Ambulatory Visit: Payer: Medicaid Other

## 2016-11-15 ENCOUNTER — Ambulatory Visit (HOSPITAL_BASED_OUTPATIENT_CLINIC_OR_DEPARTMENT_OTHER): Payer: Medicaid Other

## 2016-11-15 ENCOUNTER — Ambulatory Visit: Payer: Medicaid Other | Admitting: Hematology & Oncology

## 2017-08-20 ENCOUNTER — Encounter: Payer: Self-pay | Admitting: Gastroenterology

## 2017-11-07 ENCOUNTER — Encounter: Payer: Self-pay | Admitting: Nurse Practitioner

## 2017-11-07 ENCOUNTER — Ambulatory Visit: Payer: Self-pay | Admitting: Nurse Practitioner

## 2017-11-07 ENCOUNTER — Telehealth: Payer: Self-pay | Admitting: Nurse Practitioner

## 2017-11-07 NOTE — Progress Notes (Deleted)
Primary Care Physician:  Redmond School, MD Primary Gastroenterologist:  Dr.   Rayne Du chief complaint on file.   HPI:   Hannah Jordan is a 62 y.o. female who presents on referral from primary care for colonoscopy.  Nurse triage/phone triage was deferred to office visit due to patient complaints of abdominal pain.  Reviewed additional information provided with the referral including ***.  History of colonoscopy in our system.  Today she states   Past Medical History:  Diagnosis Date  . Anxiety   . Arthritis   . Asthma   . Complication of anesthesia    severe headache after c-sections  . Depression   . Diverticulitis   . Fibromyalgia   . Gastroesophageal reflux disease   . Headache    "years ago"  . Helicobacter pylori (H. pylori)     treated  . Hypertension   . Restless leg syndrome   . Sleep apnea    no cpap use  . Tendinitis   . Urinary tract infection     Past Surgical History:  Procedure Laterality Date  . CESAREAN SECTION     x 2  . FOOT SURGERY Right   . MASS EXCISION Right 12/06/2015   Procedure: EXCISION 3CM X 2CM MASS RIGHT CHEST WALL;  Surgeon: Vickie Epley, MD;  Location: [facility];  Service: Vascular;  Laterality: Right;  . PARTIAL HYSTERECTOMY    . TOTAL KNEE ARTHROPLASTY Right 10/03/2015   Procedure: RIGHT TOTAL KNEE ARTHROPLASTY;  Surgeon: Dorna Leitz, MD;  Location: [facility];  Service: Orthopedics;  Laterality: Right;    Current Outpatient Medications  Medication Sig Dispense Refill  . escitalopram (LEXAPRO) 10 MG tablet Take 10 mg by mouth daily.  10  . estradiol (CLIMARA - DOSED IN MG/24 HR) 0.025 mg/24hr patch Place 0.025 mg onto the skin every Tuesday.  3  . gabapentin (NEURONTIN) 400 MG capsule Take 400-800 mg by mouth 4 (four) times daily. Take one capsule three times daily and two capsules at bedtime.  4  . losartan (COZAAR) 50 MG tablet Take 50 mg by mouth daily as needed (For high blood pressure.).   11  . Melatonin 3 MG TABS Take 6-9 mg  by mouth at bedtime.    . meloxicam (MOBIC) 7.5 MG tablet Take 2 tablets (15 mg total) by mouth daily. 30 tablet 0  . NEXIUM 40 MG capsule Take 40 mg by mouth 2 (two) times daily.  11   No current facility-administered medications for this visit.     Allergies as of 11/07/2017 - Review Complete 10/12/2016  Allergen Reaction Noted  . Penicillins Itching, Swelling, and Other (See Comments) 05/02/2012  . Prednisone Other (See Comments) 05/02/2012    Family History  Problem Relation Age of Onset  . Hypertension Mother   . Diabetes Father   . Cancer Father     Social History   Socioeconomic History  . Marital status: Divorced    Spouse name: Not on file  . Number of children: Not on file  . Years of education: Not on file  . Highest education level: Not on file  Occupational History  . Not on file  Social Needs  . Financial resource strain: Not on file  . Food insecurity:    Worry: Not on file    Inability: Not on file  . Transportation needs:    Medical: Not on file    Non-medical: Not on file  Tobacco Use  . Smoking status: Never  Smoker  . Smokeless tobacco: Never Used  Substance and Sexual Activity  . Alcohol use: No  . Drug use: No  . Sexual activity: Yes    Birth control/protection: Surgical  Lifestyle  . Physical activity:    Days per week: Not on file    Minutes per session: Not on file  . Stress: Not on file  Relationships  . Social connections:    Talks on phone: Not on file    Gets together: Not on file    Attends religious service: Not on file    Active member of club or organization: Not on file    Attends meetings of clubs or organizations: Not on file    Relationship status: Not on file  . Intimate partner violence:    Fear of current or ex partner: Not on file    Emotionally abused: Not on file    Physically abused: Not on file    Forced sexual activity: Not on file  Other Topics Concern  . Not on file  Social History Narrative  . Not on  file    Review of Systems: General: Negative for anorexia, weight loss, fever, chills, fatigue, weakness. Eyes: Negative for vision changes.  ENT: Negative for hoarseness, difficulty swallowing , nasal congestion. CV: Negative for chest pain, angina, palpitations, dyspnea on exertion, peripheral edema.  Respiratory: Negative for dyspnea at rest, dyspnea on exertion, cough, sputum, wheezing.  GI: See history of present illness. GU:  Negative for dysuria, hematuria, urinary incontinence, urinary frequency, nocturnal urination.  MS: Negative for joint pain, low back pain.  Derm: Negative for rash or itching.  Neuro: Negative for weakness, abnormal sensation, seizure, frequent headaches, memory loss, confusion.  Psych: Negative for anxiety, depression, suicidal ideation, hallucinations.  Endo: Negative for unusual weight change.  Heme: Negative for bruising or bleeding. Allergy: Negative for rash or hives.    Physical Exam: There were no vitals taken for this visit. General:   Alert and oriented. Pleasant and cooperative. Well-nourished and well-developed.  Head:  Normocephalic and atraumatic. Eyes:  Without icterus, sclera clear and conjunctiva pink.  Ears:  Normal auditory acuity. Mouth:  No deformity or lesions, oral mucosa pink.  Throat/Neck:  Supple, without mass or thyromegaly. Cardiovascular:  S1, S2 present without murmurs appreciated. Normal pulses noted. Extremities without clubbing or edema. Respiratory:  Clear to auscultation bilaterally. No wheezes, rales, or rhonchi. No distress.  Gastrointestinal:  +BS, soft, non-tender and non-distended. No HSM noted. No guarding or rebound. No masses appreciated.  Rectal:  Deferred  Musculoskalatal:  Symmetrical without gross deformities. Normal posture. Skin:  Intact without significant lesions or rashes. Neurologic:  Alert and oriented x4;  grossly normal neurologically. Psych:  Alert and cooperative. Normal mood and  affect. Heme/Lymph/Immune: No significant cervical adenopathy. No excessive bruising noted.    11/07/2017 9:36 AM   Disclaimer: This note was dictated with voice recognition software. Similar sounding words can inadvertently be transcribed and may not be corrected upon review.

## 2017-11-07 NOTE — Telephone Encounter (Signed)
PATIENT WAS A NO SHOW AND LETTER SENT  °

## 2017-11-07 NOTE — Telephone Encounter (Signed)
Noted  

## 2018-03-07 IMAGING — DX DG CHEST 2V
2 series · 2 of 2 positions shown · non-contrast
Comparison: None.

CLINICAL DATA: Skin neoplasm.  History of asthma and hypertension.

EXAM:
CHEST  2 VIEW

[chest pa]
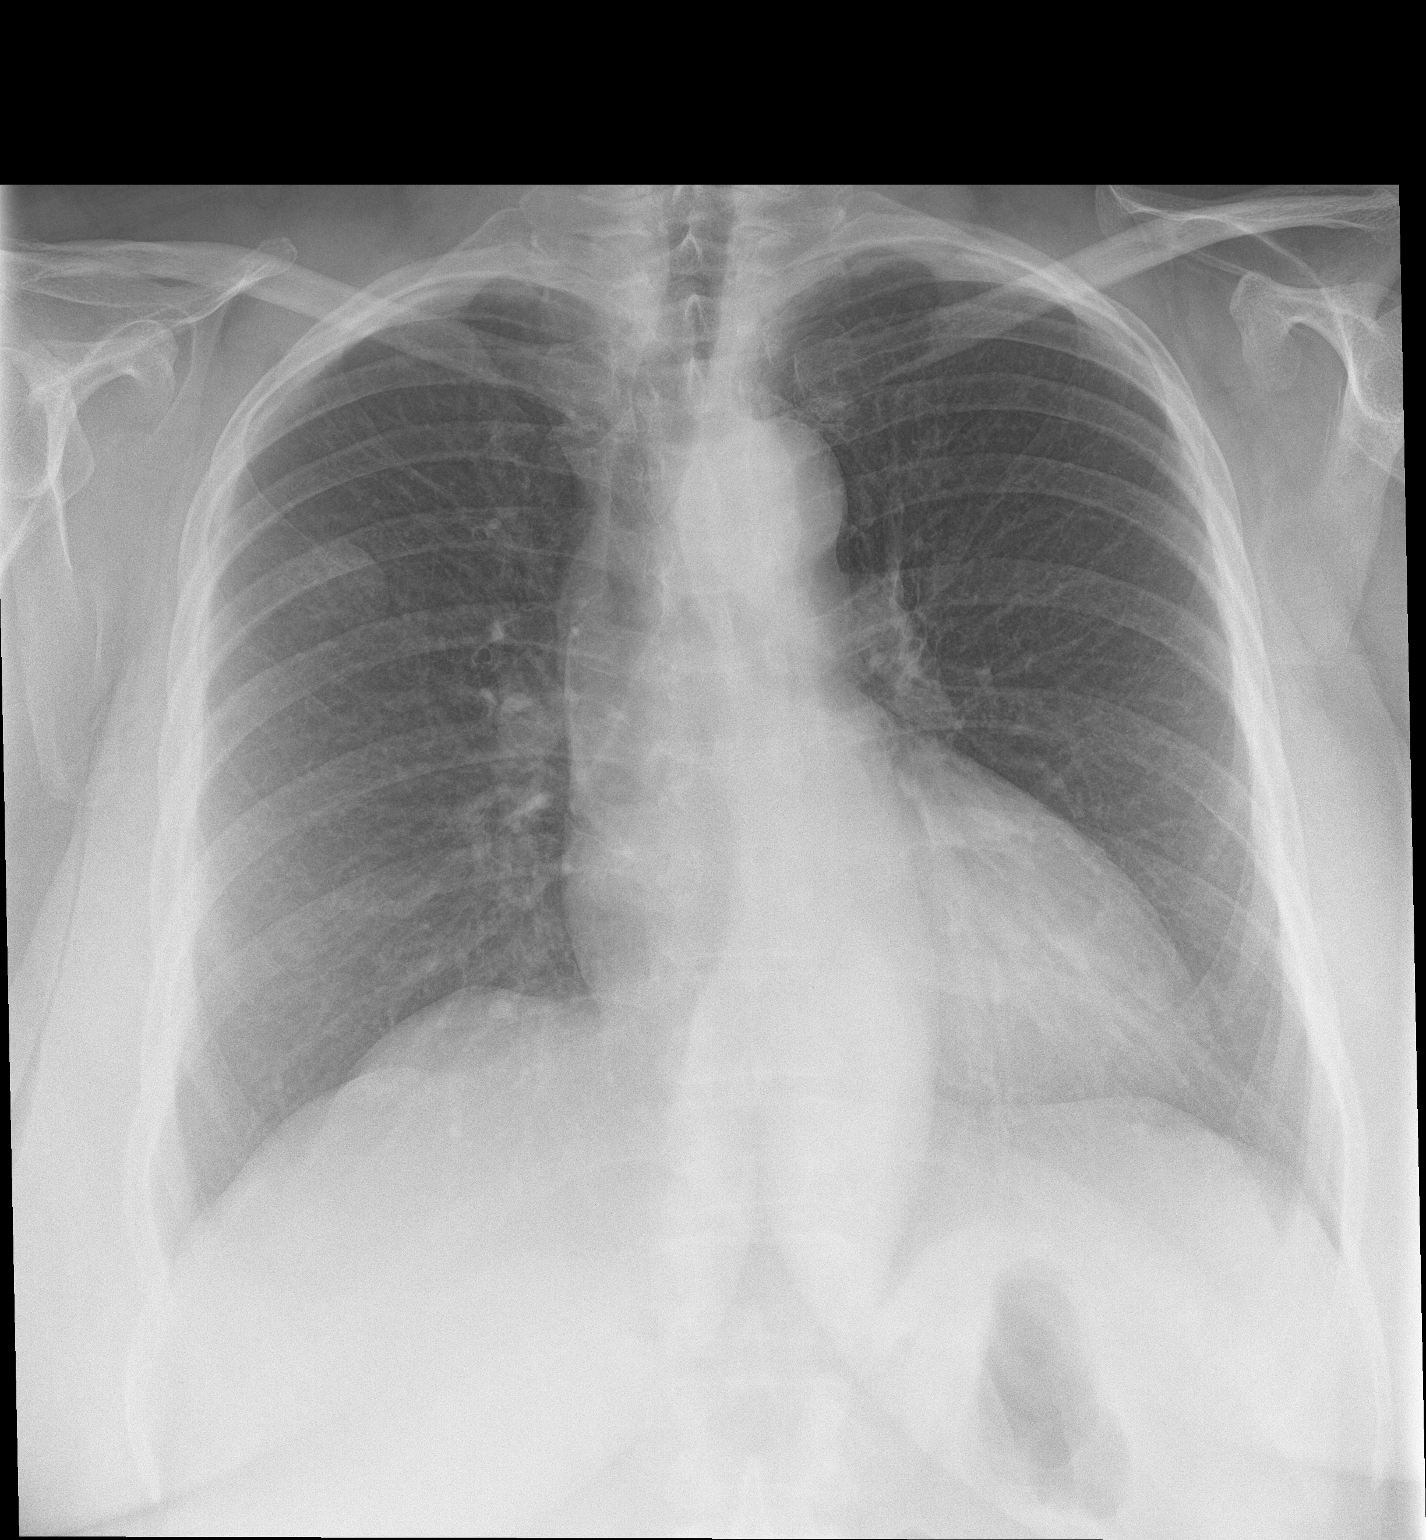

[chest lat]
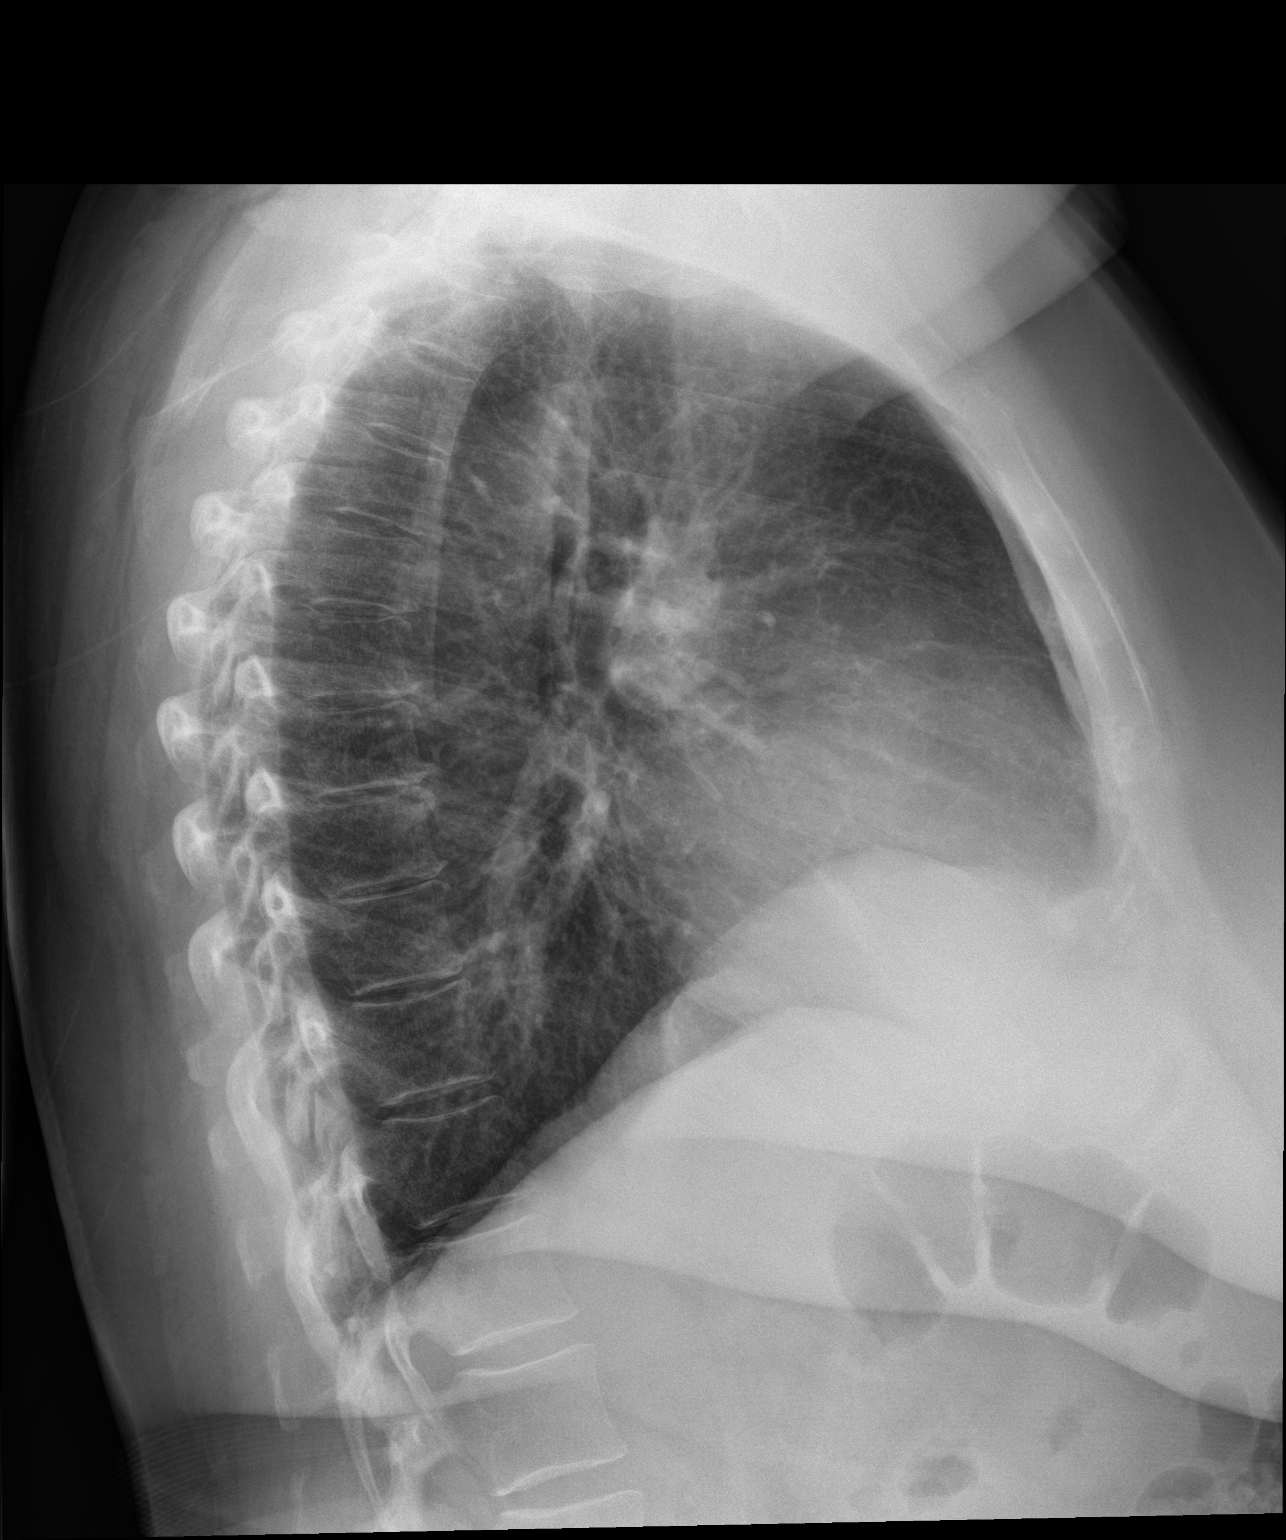

[2 of 2 positions shown; findings below may reference images not displayed]

FINDINGS: Ill-defined density is noted over the right upper chest. This could
be a soft tissue lesion. Mediastinum and hilar structures normal.
Cardiomegaly with normal pulmonary vascularity. No pleural effusion
or pneumothorax. Degenerative changes thoracic spine. No acute bony
abnormality.
IMPRESSION: 1. Ill-defined density is noted over the right upper chest. This
could be a soft tissue density. Further evaluation with
contrast-enhanced chest CT can be obtained.

2. Mild cardiomegaly.  No CHF.

## 2018-04-14 ENCOUNTER — Other Ambulatory Visit: Payer: Self-pay

## 2018-04-14 ENCOUNTER — Emergency Department (HOSPITAL_BASED_OUTPATIENT_CLINIC_OR_DEPARTMENT_OTHER): Payer: Medicaid Other

## 2018-04-14 ENCOUNTER — Emergency Department (HOSPITAL_BASED_OUTPATIENT_CLINIC_OR_DEPARTMENT_OTHER)
Admission: EM | Admit: 2018-04-14 | Discharge: 2018-04-14 | Disposition: A | Discharge status: Home / Self Care | Payer: Medicaid Other | Attending: Emergency Medicine | Admitting: Emergency Medicine

## 2018-04-14 ENCOUNTER — Encounter (HOSPITAL_BASED_OUTPATIENT_CLINIC_OR_DEPARTMENT_OTHER): Payer: Self-pay

## 2018-04-14 DIAGNOSIS — Z79899 Other long term (current) drug therapy: Secondary | ICD-10-CM | POA: Diagnosis not present

## 2018-04-14 DIAGNOSIS — I10 Essential (primary) hypertension: Secondary | ICD-10-CM | POA: Diagnosis not present

## 2018-04-14 DIAGNOSIS — J209 Acute bronchitis, unspecified: Secondary | ICD-10-CM | POA: Diagnosis not present

## 2018-04-14 DIAGNOSIS — R05 Cough: Secondary | ICD-10-CM | POA: Diagnosis present

## 2018-04-14 MED ORDER — HYDROCOD POLST-CPM POLST ER 10-8 MG/5ML PO SUER: 5.0000 mL | Freq: Two times a day (BID) | ORAL | 0 refills | Status: AC | PRN

## 2018-04-14 MED ORDER — AZITHROMYCIN 250 MG PO TABS: 250.0000 mg | ORAL_TABLET | Freq: Every day | ORAL | 0 refills | Status: AC

## 2018-04-14 MED ORDER — IPRATROPIUM-ALBUTEROL 0.5-2.5 (3) MG/3ML IN SOLN
3.0000 mL | Freq: Four times a day (QID) | RESPIRATORY_TRACT | Status: DC
  Administered 2018-04-14: 3 mL via RESPIRATORY_TRACT
  Filled 2018-04-14: qty 3

## 2018-04-14 MED ORDER — ALBUTEROL SULFATE HFA 108 (90 BASE) MCG/ACT IN AERS
1.0000 | INHALATION_SPRAY | RESPIRATORY_TRACT | Status: DC | PRN
  Filled 2018-04-14: qty 6.7

## 2018-04-14 MED ORDER — AZITHROMYCIN 250 MG PO TABS
500.0000 mg | ORAL_TABLET | Freq: Once | ORAL | Status: AC
  Administered 2018-04-14: 500 mg via ORAL
  Filled 2018-04-14: qty 2

## 2018-04-14 MED ORDER — AEROCHAMBER PLUS FLO-VU MEDIUM MISC
1.0000 | Freq: Once | Status: AC
  Administered 2018-04-14: 1
  Filled 2018-04-14: qty 1

## 2018-04-14 MED ORDER — DEXAMETHASONE SODIUM PHOSPHATE 10 MG/ML IJ SOLN
10.0000 mg | Freq: Once | INTRAMUSCULAR | Status: AC
  Administered 2018-04-14: 10 mg via INTRAMUSCULAR
  Filled 2018-04-14: qty 1

## 2018-04-14 NOTE — ED Triage Notes (Signed)
C/o flu like sx x 2 weeks-dizziness x 2 days-NAD-steady gait-strong cough with hoarse voice

## 2018-04-14 NOTE — ED Provider Notes (Signed)
[facility] DEPART[facility] Provider Note   CSN: [id number] Arrival date & time: 04/14/18  1306     History   Chief Complaint Chief Complaint  Patient presents with  . Cough    HPI Hannah Jordan is a 63 y.o. female.  Pt presents to the ED today with a cough and chills.  Pt said she's been sick since Thanksgiving, but has been worse over the last few days.  The pt denies any fever.  She has been taking otc cough meds which have not been helping.  Pt denies smoking, but has been around other people who smoke.     Past Medical History:  Diagnosis Date  . Anxiety   . Arthritis   . Asthma   . Complication of anesthesia    severe headache after c-sections  . Depression   . Diverticulitis   . Fibromyalgia   . Gastroesophageal reflux disease   . Headache    "years ago"  . Helicobacter pylori (H. pylori)     treated  . Hypertension   . Restless leg syndrome   . Sleep apnea    no cpap use  . Tendinitis   . Urinary tract infection     Patient Active Problem List   Diagnosis Date Noted  . Basal cell carcinoma of anterior chest 07/11/2016  . Primary osteoarthritis of right knee 10/03/2015    Past Surgical History:  Procedure Laterality Date  . CESAREAN SECTION     x 2  . FOOT SURGERY Right   . MASS EXCISION Right 12/06/2015   Procedure: EXCISION 3CM X 2CM MASS RIGHT CHEST WALL;  Surgeon: Vickie Epley, MD;  Location: [facility];  Service: Vascular;  Laterality: Right;  . PARTIAL HYSTERECTOMY    . TOTAL KNEE ARTHROPLASTY Right 10/03/2015   Procedure: RIGHT TOTAL KNEE ARTHROPLASTY;  Surgeon: Dorna Leitz, MD;  Location: [facility];  Service: Orthopedics;  Laterality: Right;     OB History   No obstetric history on file.      Home Medications    Prior to Admission medications   Medication Sig Start Date End Date Taking? Authorizing Provider  azithromycin (ZITHROMAX) 250 MG tablet Take 1 tablet (250 mg total) by mouth daily. Take first 2 tablets  together, then 1 every day until finished. 04/15/18   Isla Pence, MD  chlorpheniramine-HYDROcodone (TUSSIONEX PENNKINETIC ER) 10-8 MG/5ML SUER Take 5 mLs by mouth every 12 (twelve) hours as needed for cough. 04/14/18   Isla Pence, MD  escitalopram (LEXAPRO) 10 MG tablet Take 10 mg by mouth daily. 06/22/16   [provider]  estradiol (CLIMARA - DOSED IN MG/24 HR) 0.025 mg/24hr patch Place 0.025 mg onto the skin every Tuesday. 09/06/15   [provider]  gabapentin (NEURONTIN) 400 MG capsule Take 400-800 mg by mouth 4 (four) times daily. Take one capsule three times daily and two capsules at bedtime. 09/06/15   [provider]  losartan (COZAAR) 50 MG tablet Take 50 mg by mouth daily as needed (For high blood pressure.).  06/19/15   [provider]  Melatonin 3 MG TABS Take 6-9 mg by mouth at bedtime.    [provider]  meloxicam (MOBIC) 7.5 MG tablet Take 2 tablets (15 mg total) by mouth daily. 10/12/16   Cartner, Marland Kitchen, PA-C  NEXIUM 40 MG capsule Take 40 mg by mouth 2 (two) times daily. 08/24/15   [provider]    Family History Family History  Problem Relation Age  of Onset  . Hypertension Mother   . Diabetes Father   . Cancer Father     Social History Social History   Tobacco Use  . Smoking status: Never Smoker  . Smokeless tobacco: Never Used  Substance Use Topics  . Alcohol use: No  . Drug use: No     Allergies   Penicillins and Prednisone   Review of Systems Review of Systems  Respiratory: Positive for cough.   All other systems reviewed and are negative.    Physical Exam Updated Vital Signs BP (!) 188/98 (BP Location: Left Arm)   Pulse 95   Temp 98 F (36.7 C) (Oral)   Resp (!) 22   Ht 5\' 2"  (1.575 m)   Wt 102.5 kg   SpO2 97%   BMI 41.34 kg/m   Physical Exam Vitals signs and nursing note reviewed.  Constitutional:      Appearance: Normal appearance. She is obese.  HENT:     Head: Normocephalic  and atraumatic.     Right Ear: Tympanic membrane, ear canal and external ear normal.     Left Ear: Tympanic membrane, ear canal and external ear normal.     Nose: Nose normal.     Mouth/Throat:     Mouth: Mucous membranes are moist.     Pharynx: Oropharynx is clear.  Eyes:     Extraocular Movements: Extraocular movements intact.     Pupils: Pupils are equal, round, and reactive to light.  Neck:     Musculoskeletal: Normal range of motion and neck supple.  Cardiovascular:     Rate and Rhythm: Normal rate.     Pulses: Normal pulses.     Heart sounds: Normal heart sounds.  Pulmonary:     Breath sounds: Wheezing present.  Abdominal:     General: Abdomen is flat. Bowel sounds are normal.     Palpations: Abdomen is soft.  Musculoskeletal: Normal range of motion.  Skin:    General: Skin is warm.     Capillary Refill: Capillary refill takes less than 2 seconds.  Neurological:     General: No focal deficit present.     Mental Status: She is alert and oriented to person, place, and time.  Psychiatric:        Mood and Affect: Mood normal.        Behavior: Behavior normal.      ED Treatments / Results  Labs (all labs ordered are listed, but only abnormal results are displayed) Labs Reviewed - No data to display  EKG None  Radiology Dg Chest 2 View  Result Date: 04/14/2018 CLINICAL DATA:  Cough. EXAM: CHEST - 2 VIEW COMPARISON:  Radiographs of December 02, 2015. FINDINGS: The heart size and mediastinal contours are within normal limits. Both lungs are clear. No pneumothorax or pleural effusion is noted. The visualized skeletal structures are unremarkable. IMPRESSION: No active cardiopulmonary disease. Electronically Signed   By: Marijo Conception, M.D.   On: 04/14/2018 13:40    Procedures Procedures (including critical care time)  Medications Ordered in ED Medications  ipratropium-albuterol (DUONEB) 0.5-2.5 (3) MG/3ML nebulizer solution 3 mL (3 mLs Nebulization Given 04/14/18 1530)    albuterol (PROVENTIL HFA;VENTOLIN HFA) 108 (90 Base) MCG/ACT inhaler 1-2 puff (has no administration in time range)  AEROCHAMBER PLUS FLO-VU MEDIUM MISC 1 each (1 each Other Given 04/14/18 1530)  dexamethasone (DECADRON) injection 10 mg (10 mg Intramuscular Given 04/14/18 1530)  azithromycin (ZITHROMAX) tablet 500 mg (500 mg Oral Given 04/14/18  1530)     Initial Impression / Assessment and Plan / ED Course  I have reviewed the triage vital signs and the nursing notes.  Pertinent labs & imaging results that were available during my care of the patient were reviewed by me and considered in my medical decision making (see chart for details).    No pna on cxr, but sx have been going on for weeks.  Pt given decadron and duoneb in ED.  She is given an albuterol inhaler plus spacer for home use.  She is encouraged to avoid smoke.  She will be started on zithromax.  She is instructed to return if worse.  Final Clinical Impressions(s) / ED Diagnoses   Final diagnoses:  Acute bronchitis, unspecified organism    ED Discharge Orders         Ordered    azithromycin (ZITHROMAX) 250 MG tablet  Daily     04/14/18 1551    chlorpheniramine-HYDROcodone (TUSSIONEX PENNKINETIC ER) 10-8 MG/5ML SUER  Every 12 hours PRN     04/14/18 1551           Isla Pence, MD 04/14/18 1554

## 2018-10-28 ENCOUNTER — Other Ambulatory Visit: Payer: Self-pay | Admitting: Internal Medicine

## 2018-10-28 DIAGNOSIS — N6489 Other specified disorders of breast: Secondary | ICD-10-CM

## 2018-12-02 ENCOUNTER — Encounter: Payer: Self-pay | Admitting: Orthopedic Surgery

## 2019-01-14 ENCOUNTER — Telehealth: Payer: Self-pay | Admitting: Adult Health

## 2019-01-14 NOTE — Telephone Encounter (Signed)
Tried to reach the patient to remind her of her appointment/restrictions, call can not be completed at this time. °

## 2019-01-15 ENCOUNTER — Other Ambulatory Visit: Payer: Medicaid Other | Admitting: Adult Health

## 2019-01-20 ENCOUNTER — Encounter: Payer: Self-pay | Admitting: Gastroenterology

## 2019-02-03 ENCOUNTER — Ambulatory Visit: Payer: Medicaid Other | Admitting: Gastroenterology

## 2019-02-03 ENCOUNTER — Encounter: Payer: Self-pay | Admitting: Gastroenterology

## 2019-02-03 ENCOUNTER — Telehealth: Payer: Self-pay | Admitting: Gastroenterology

## 2019-02-03 NOTE — Telephone Encounter (Signed)
PATIENT WAS A NO SHOW AND LETTER SENT  °

## 2020-05-10 DEATH — deceased
